# Patient Record
Sex: Male | Born: 1990 | Race: Black or African American | Hispanic: No | Marital: Single | Smoking: Never smoker
Health system: Southern US, Community
[De-identification: ages and names within clinical notes are randomized; demographics above are authoritative.]

## PROBLEM LIST (undated history)

## (undated) DIAGNOSIS — B20 Human immunodeficiency virus [HIV] disease: Secondary | ICD-10-CM

## (undated) DIAGNOSIS — Z789 Other specified health status: Secondary | ICD-10-CM

## (undated) DIAGNOSIS — Z21 Asymptomatic human immunodeficiency virus [HIV] infection status: Secondary | ICD-10-CM

## (undated) DIAGNOSIS — J45909 Unspecified asthma, uncomplicated: Secondary | ICD-10-CM

## (undated) HISTORY — DX: Asymptomatic human immunodeficiency virus (hiv) infection status: Z21

## (undated) HISTORY — DX: Unspecified asthma, uncomplicated: J45.909

## (undated) HISTORY — DX: Human immunodeficiency virus (HIV) disease: B20

---

## 2014-04-30 ENCOUNTER — Encounter (INDEPENDENT_AMBULATORY_CARE_PROVIDER_SITE_OTHER): Payer: Self-pay

## 2014-04-30 ENCOUNTER — Telehealth (INDEPENDENT_AMBULATORY_CARE_PROVIDER_SITE_OTHER): Payer: Self-pay

## 2014-04-30 NOTE — Telephone Encounter (Signed)
Contacted new patient before intake appointment to offer transportation assistance for the first appointment and to make sure he knows what to bring in and where to go but was unable to speak to patient and left a voicemail with name and number to call back.

## 2014-04-30 NOTE — Progress Notes (Signed)
*  04-30-14 Phone Intake Interview  New Intake  Client is a 23 year old [place] American male who currently resides in [place], [place] with his partner. Client states he was first diagnosed on September 11, 2013 and was referred from his previous medical care in [place]. Client states he is currently not employed and does not receive income. Client states he does have [employer] as his medical insurance. Client states he doesn't know his CD4 count, viral load or when he had his last TB test. Client states he was hospitalized a month ago for a bloody stool. Client states he also has asthma. Client states he has no history of mental illness or substance abuse and does not require special needs. Client was instructed to bring in his photo ID, verification of address, proof of income, medical insurance card and western blot. Client will receive his care at [facility].     In-Person Intake Appointment:  Eligibility Visit: May 01, 2014 at 2:00 PM with Cleatrice Burke   Nurse Visit: May 01, 2014 at 3:00 PM with Hazle Nordmann   Social Worker Visit: May 01, 2014 at 4:00 PM with Clinton Gallant

## 2014-05-01 ENCOUNTER — Ambulatory Visit (INDEPENDENT_AMBULATORY_CARE_PROVIDER_SITE_OTHER): Payer: Self-pay

## 2014-05-01 VITALS — BP 121/85 | HR 88 | Temp 98.5°F

## 2014-05-01 DIAGNOSIS — B20 Human immunodeficiency virus [HIV] disease: Secondary | ICD-10-CM

## 2014-05-01 DIAGNOSIS — K6289 Other specified diseases of anus and rectum: Secondary | ICD-10-CM

## 2014-05-01 NOTE — Progress Notes (Signed)
This Clinical research associate met with the Timothy Monroe for intake. Timothy Monroe is a 23 year old homosexual African American male who appeared his stated age. Timothy Monroe recently moved from [place] to [place] with his partner. Timothy Monroe was diagnosed in 2014 and has not started ART. Timothy Monroe has been in foster care since age 36 and will age out at 62. Timothy Monroe has [employer] in [place] and was advised to contact [facility] to see if Medicaid can be offered to him in [place]. Timothy Monroe was advised that during this process, he can be enrolled in [facility]. Timothy Monroe is scheduled to return next week for psychosocial and education and two weeks for H&P.

## 2014-05-02 DIAGNOSIS — J45909 Unspecified asthma, uncomplicated: Secondary | ICD-10-CM | POA: Insufficient documentation

## 2014-05-02 DIAGNOSIS — B20 Human immunodeficiency virus [HIV] disease: Secondary | ICD-10-CM | POA: Insufficient documentation

## 2014-05-02 DIAGNOSIS — K6289 Other specified diseases of anus and rectum: Secondary | ICD-10-CM | POA: Insufficient documentation

## 2014-05-02 LAB — T-HELPER CELLS (CD4) COUNT
% CD 4 Positive Lymphocytes: 21.2 % — ABNORMAL LOW (ref 30.8–58.5)
Absolute CD 4 Helper: 297 /uL — ABNORMAL LOW (ref 359–1519)
Baso(Absolute): 0.1 10*3/uL (ref 0.0–0.2)
Basos: 1 %
Eos: 0 %
Eosinophils Absolute: 0 10*3/uL (ref 0.0–0.4)
Hematocrit: 41.5 % (ref 37.5–51.0)
Hemoglobin: 14.6 g/dL (ref 12.6–17.7)
Immature Granulocytes Absolute: 0 10*3/uL (ref 0.0–0.1)
Immature Granulocytes: 0 %
Lymphocytes Absolute: 1.4 10*3/uL (ref 0.7–3.1)
Lymphocytes: 19 %
MCH: 29.7 pg (ref 26.6–33.0)
MCHC: 35.2 g/dL (ref 31.5–35.7)
MCV: 85 fL (ref 79–97)
Monocytes Absolute: 0.4 10*3/uL (ref 0.1–0.9)
Monocytes: 6 %
Neutrophils Absolute: 5.8 10*3/uL (ref 1.4–7.0)
Neutrophils: 74 %
Platelets: 407 10*3/uL — ABNORMAL HIGH (ref 150–379)
RBC: 4.91 x10E6/uL (ref 4.14–5.80)
RDW: 13.9 % (ref 12.3–15.4)
WBC: 7.8 10*3/uL (ref 3.4–10.8)

## 2014-05-02 NOTE — Progress Notes (Signed)
Met patient jointly with SW for intake    Intake    Patient is a 23 year old, friendly AA man who recently moved to [place] from [place] a couple of weeks ago. He is currently unemployed. He is still in forster care until he turns 23 in December, 2015. He has [employer] from [place] through forster care. He is living with a friend in [place]. He is currently working on changing his Medicaid from [place] to [place]. He got in contact with his SW in [place] and discussed how to change it.     Medical History    Patient diagnosed in 08/2013 at [facility] in [place]. His CD4 nadir was 381, there is no record of VL. Labs were drawn again and CD4 and VL were 391 and 64403 on 10/09/13.  He was infected via a sexual encounter with a male. He is allergic to Acetaminophen and water melons. He is naive to HAART. He has asthma, a history of GC and chlamydia proctitis, has current anal pain, history of positive stool guaiac. Patient had CT of abdomen and pelvis for anal pain and it was negative. Patient denies substance abuse, he says he has no mental health issues. He is a social drinker. He started smoking cigarettes when the anal pain started, he was not a smoker before. He says when he gets the anal pain, he has to smoke and it eases the pain.   Patient was in so much pain when he came in for intake. PA saw him and examined his anal area, patient was screaming in pain. He was advised to go to the ER. We offered to intake patient another day, he declined and asked to proceed with the intake. He promised to go to the ER after intake.      Family History    Patient is one for 14 siblings. Both his parents are alive and are in [place]. He was put into forster care when his father was in jail and his mother could look after all the children. He has shared his diagnosis with his father only. His family is aware of his sexuality.     Plan    As patient was in pain, I called [facility] and faxed a signed release of information from patient and the records were  faxed. Patient had intake labs drawn and he is scheduled for HIV education, PSA with me and SW next week and an H&P with PA is in two weeks. Patient advised to go to the ER and get medical attention for the anal pain. He verbalized understanding the instructions.

## 2014-05-03 ENCOUNTER — Telehealth (INDEPENDENT_AMBULATORY_CARE_PROVIDER_SITE_OTHER): Payer: Self-pay

## 2014-05-03 LAB — HIV-1/2 ANTIBODY, DIFFERENTIATION
HIV 1 Ab: REACTIVE — AB
HIV 2 Ab: NONREACTIVE

## 2014-05-03 LAB — REFLEX - QUANTIFERON IN TUBE
QFT TB Ag minus Nil Value: 0 IU/mL
QuantiFERON Mitogen Value: 9.79 IU/mL
QuantiFERON Nil Value: 0.04 IU/mL
QuantiFERON TB Ag Value: 0.04 IU/mL
QuantiFERON TB Gold: NEGATIVE

## 2014-05-03 LAB — QUANTIFERON(R)-TB GOLD ITM (HLAB)

## 2014-05-03 NOTE — Telephone Encounter (Signed)
Called patient and left a message on his voicemail. Patient was supposed to go to the ER soon after leaving here on Tuesday for medical attention to his anal pain. I checked patient's chart and he was not seen at any [facility] facilities. I also asked him what day would be convenient to be seen by a colon rectal surgeon. I asked him to call me when he gets a chance.

## 2014-05-04 LAB — HIV-1 RNA, QUANTITATIVE, PCR
HIV-1 RNA by PCR: 19780 copies/mL
LOG10 HIV-1 RNA: 4.296 log10copy/mL

## 2014-05-04 LAB — TOXOPLASMA GONDII ANTIBODY, IGG: Toxoplasma Gondii, IgG: 62.9 IU/mL — ABNORMAL HIGH (ref 0.0–7.1)

## 2014-05-04 LAB — RPR, RFX QN RPR/CONFIRM TP: RPR: NONREACTIVE

## 2014-05-04 LAB — HLA-B 5701 GENOTYPE, ABACAVIR: Result:: NEGATIVE

## 2014-05-05 LAB — COMPREHENSIVE METABOLIC PANEL
ALT: 31 IU/L (ref 0–44)
AST (SGOT): 43 IU/L — ABNORMAL HIGH (ref 0–40)
Albumin/Globulin Ratio: 1 — ABNORMAL LOW (ref 1.1–2.5)
Albumin: 4.3 g/dL (ref 3.5–5.5)
Alkaline Phosphatase: 52 IU/L (ref 39–117)
BUN / Creatinine Ratio: 12 (ref 8–19)
BUN: 10 mg/dL (ref 6–20)
Bilirubin, Total: 0.3 mg/dL (ref 0.0–1.2)
CO2: 18 mmol/L (ref 18–29)
Calcium: 9 mg/dL (ref 8.7–10.2)
Chloride: 99 mmol/L (ref 97–108)
Creatinine: 0.82 mg/dL (ref 0.76–1.27)
EGFR: 127 mL/min/{1.73_m2} (ref >59)
EGFR: 147 mL/min/{1.73_m2} (ref >59)
Globulin, Total: 4.4 g/dL (ref 1.5–4.5)
Glucose: 48 mg/dL — ABNORMAL LOW (ref 65–99)
Potassium: 6.7 mmol/L (ref 3.5–5.2)
Protein, Total: 8.7 g/dL — ABNORMAL HIGH (ref 6.0–8.5)
Sodium: 145 mmol/L — ABNORMAL HIGH (ref 134–144)

## 2014-05-05 LAB — G-6-PD, RBC: G-6-PD, Quant: 129 U/10E12 RBC — ABNORMAL LOW (ref 146–376)

## 2014-05-11 ENCOUNTER — Ambulatory Visit (INDEPENDENT_AMBULATORY_CARE_PROVIDER_SITE_OTHER): Payer: Self-pay

## 2014-05-11 DIAGNOSIS — Z719 Counseling, unspecified: Secondary | ICD-10-CM

## 2014-05-11 DIAGNOSIS — B20 Human immunodeficiency virus [HIV] disease: Secondary | ICD-10-CM

## 2014-05-11 NOTE — Progress Notes (Signed)
Met with patient for HIV basic education. Patient admitted he did not go to the ER for medical care of the anal pain when he was intaked last week. He told me the anal pain is better and he continues to take the antibiotics and take sitz baths before and after bowel movements. Patient expressed concern about his low CD4. He has an appt for an H&P with PA next week. He has no resolved the issue with his Medicaid. ADAP forms completed and faxed to [facility] so that when he sees PA, he is approved.   Covered the following during the education session:   How HIV is transmitted - sexual intercourse (anal or vaginal), IV drug use, pregnant mother who does not get HIV treatment during pregnancy and who breast feeds the newborn   Discussed that HIV is not transmitted through sweat, tears, sharing toilet seats, drinks, silverware, or eating from someone's plate, donating blood at a blood bank, being kissed/coughed or sneezed on   Identified high risk behavior and how to prevent transmission to toher   The meaning of CD4 count and HIV viral load in tracking disease progression and medication effectiveness   Discussed safer sex practices   The importance of food safety, good nutrition in promoting health   The need for exercise and rest in promoting health   How to care for pets while avoiding infections   Drinking water from a reliable water source such as water tap and not wells   The ill effects of alcohol, tobacco products and illicit drugs on the body and its ability to heal   How to prevent HIV transmission from accidents in the home   Benefits and difficulties of treatment   Benefits and difficulties of disclosure    We also discussed the importance of coming to all scheduled appts or calling to reschedule. Patient asked questions during the discussion. He had no concerns at end of visit.

## 2014-05-14 ENCOUNTER — Other Ambulatory Visit (INDEPENDENT_AMBULATORY_CARE_PROVIDER_SITE_OTHER): Payer: Self-pay

## 2014-05-14 ENCOUNTER — Encounter (INDEPENDENT_AMBULATORY_CARE_PROVIDER_SITE_OTHER): Payer: Self-pay

## 2014-05-14 NOTE — Telephone Encounter (Signed)
Error

## 2014-05-14 NOTE — Progress Notes (Signed)
Called [facility] regarding genotype results not in chart. Spoke with Pam and she said labs take 7 to 10 business day. Emailed [facility] Dana Pius she is out of office until Wednesday. Called PA-C to inform her that labs are not ready and pt has an appt for tomorrow for H&P.

## 2014-05-15 ENCOUNTER — Encounter (INDEPENDENT_AMBULATORY_CARE_PROVIDER_SITE_OTHER): Payer: Self-pay | Admitting: Medical

## 2014-05-15 ENCOUNTER — Ambulatory Visit (INDEPENDENT_AMBULATORY_CARE_PROVIDER_SITE_OTHER): Payer: Self-pay | Admitting: Medical

## 2014-05-15 ENCOUNTER — Ambulatory Visit (INDEPENDENT_AMBULATORY_CARE_PROVIDER_SITE_OTHER): Payer: Self-pay

## 2014-05-15 VITALS — BP 117/73 | HR 105 | Temp 99.5°F | Wt 158.6 lb

## 2014-05-15 DIAGNOSIS — K6289 Other specified diseases of anus and rectum: Secondary | ICD-10-CM

## 2014-05-15 DIAGNOSIS — Z23 Encounter for immunization: Secondary | ICD-10-CM

## 2014-05-15 DIAGNOSIS — B20 Human immunodeficiency virus [HIV] disease: Secondary | ICD-10-CM

## 2014-05-15 NOTE — Addendum Note (Signed)
Addended by: Joni Reining on: 05/15/2014 04:50 PM     Modules accepted: Orders

## 2014-05-15 NOTE — Progress Notes (Signed)
Subjective:       Patient ID: Timothy Monroe is a 23 y.o. male.    HPI  Timothy Monroe is 23 year old AA homosexual male, he is here today to follow up on his HIV disease.  He was diagnosed with HIV on 08/2013, while he was seen at ER for severe rectal pain, he was diagnosed with HIV and proctitis.  He believes he became infected via unprotected sex , he is naive to HAART.  Today he continues to complaint of rectal pain and brown d/c. He is on naproxen for pain, he was seen during his intake and was advised to go to the  ER due to severe pain, he didn't go.  He agrees today to have an anal pap/ gc/chlamydia testing. He will  Be refer to colorectal.  The following portions of the patient's history were reviewed and updated as appropriate: past family history and past social history.    Review of Systems   Constitutional: Negative.    HENT: Negative.    Eyes: Negative.    Respiratory: Negative.    Cardiovascular: Negative.    Gastrointestinal: Positive for rectal pain. Negative for nausea, vomiting, abdominal pain, diarrhea, constipation, blood in stool, abdominal distention and anal bleeding.   Endocrine: Negative.    Genitourinary: Negative.    Musculoskeletal: Negative.    Skin: Negative.    Allergic/Immunologic: Negative.    Neurological: Negative.    Hematological: Negative.    Psychiatric/Behavioral: Negative.            Objective:    Physical Exam   Constitutional: He is oriented to person, place, and time. He appears well-developed and well-nourished. No distress.   HENT:   Head: Normocephalic and atraumatic.   Mouth/Throat: Oropharynx is clear and moist. No oropharyngeal exudate.   Large tonsils. Gc/chlamydia done,   Eyes: Conjunctivae and EOM are normal. Pupils are equal, round, and reactive to light. No scleral icterus.   Neck: Normal range of motion. Neck supple. No tracheal deviation present. No thyromegaly present.   Cardiovascular: Normal rate, regular rhythm, normal heart sounds and intact distal pulses.     No murmur heard.  Pulmonary/Chest: Effort normal and breath sounds normal. No respiratory distress. He has no wheezes. He has no rales.   Abdominal: Soft. Bowel sounds are normal. He exhibits no distension and no mass. There is no tenderness. There is no rebound and no guarding.   Genitourinary: Penis normal. No penile tenderness.   Agree to anal pap/gc/chlamydia testing of rectum.  Tolerated procedure well, brown d/c noted.   Musculoskeletal: Normal range of motion. He exhibits no edema or tenderness.   Lymphadenopathy:     He has no cervical adenopathy.   Neurological: He is alert and oriented to person, place, and time. No cranial nerve deficit.   Skin: Skin is warm and dry. No rash noted. He is not diaphoretic. No erythema.   Psychiatric: He has a normal mood and affect. His behavior is normal. Judgment and thought content normal.   Vitals reviewed.          Assessment:     HIV   HM  Rectal pain      Plan:      Procedures  HIV: stable, cd4a t 297 and vl 19780, diagnosed with HIV on 08/2013.  Naive to HAART, Clinical research associate explained HIV disease course and prognosis, meaning of current cd4 and vl counts, risk and benefits of HAART, importance of adherence to treatment and follow ups.  Writer discussed several treatment options" stribild/triumeq/or PI base , he decided to start "Stribild" , genotype is pending, if pan-sensitive, he will start 'stribild.  Safe sex practices discussed, encouraged 100% condom use.  HM: pneumovax 13 today, states he is utd with dtap. Needs to do hepatitis panel. Will follow up result.

## 2014-05-15 NOTE — Progress Notes (Signed)
Timothy Monroe from [facility] called and explained that the during the lab first run the amplification failed and they had to reset it and it takes approximately 8-10 days to have a result. She said we will get the results by Friday or Saturday.

## 2014-05-16 ENCOUNTER — Encounter (INDEPENDENT_AMBULATORY_CARE_PROVIDER_SITE_OTHER): Payer: Self-pay

## 2014-05-16 LAB — COMPREHENSIVE METABOLIC PANEL
ALT: 33 IU/L (ref 0–44)
AST (SGOT): 29 IU/L (ref 0–40)
Albumin/Globulin Ratio: 1 — ABNORMAL LOW (ref 1.1–2.5)
Albumin: 4.1 g/dL (ref 3.5–5.5)
Alkaline Phosphatase: 51 IU/L (ref 39–117)
BUN / Creatinine Ratio: 19 (ref 8–19)
BUN: 14 mg/dL (ref 6–20)
Bilirubin, Total: 0.3 mg/dL (ref 0.0–1.2)
CO2: 23 mmol/L (ref 18–29)
Calcium: 9.3 mg/dL (ref 8.7–10.2)
Chloride: 100 mmol/L (ref 97–108)
Creatinine: 0.75 mg/dL — ABNORMAL LOW (ref 0.76–1.27)
EGFR: 132 mL/min/{1.73_m2} (ref >59)
EGFR: 153 mL/min/{1.73_m2} (ref >59)
Globulin, Total: 4.2 g/dL (ref 1.5–4.5)
Glucose: 85 mg/dL (ref 65–99)
Potassium: 4.6 mmol/L (ref 3.5–5.2)
Protein, Total: 8.3 g/dL (ref 6.0–8.5)
Sodium: 141 mmol/L (ref 134–144)

## 2014-05-16 LAB — HP5+HAVIGM PANEL 201359
HCV AB: <0.1 s/co ratio (ref 0.0–0.9)
Hep A IgM: NEGATIVE
Hepatitis  B Surface Ab, Qualitative: REACTIVE
Hepatitis A Total Antibody: POSITIVE — AB
Hepatitis B Core Total AB: NEGATIVE
Hepatitis B Surface Antigen: NEGATIVE

## 2014-05-16 LAB — ANAL (RECTAL) CYTOLOGY, LIQUID BASED PAP

## 2014-05-16 NOTE — Progress Notes (Signed)
Called [facility] [phone] ) and scheduled an appt for patient. Patient will see Dr Rema Jasmine on 05/23/14 at 1pm. Provider note and specialty referral faxed to the office [phone]). Patient given the address for his appt: [address], [address], [place], [place] [address], tel: [phone]. Patient verbalized understanding instructions.
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
## 2014-05-17 LAB — CT/GC NAA, PHARYNGEAL
C. trachomatis, NAA, Pharyn: NEGATIVE
N. gonorrhoeae, NAA, Pharyn: NEGATIVE

## 2014-05-17 LAB — CT/GC NAA, RECTAL
CHLAMYDIA TRACHOMATIS, NAA: NEGATIVE
Neisseria gonorrhoeae, NAA: NEGATIVE
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
## 2014-05-21 ENCOUNTER — Other Ambulatory Visit (INDEPENDENT_AMBULATORY_CARE_PROVIDER_SITE_OTHER): Payer: Self-pay

## 2014-05-21 LAB — HIV-1 GENOTYPIC DRUG RESISTANCE, P

## 2014-05-21 NOTE — Progress Notes (Signed)
Called [facility] the results for genosure labs are not ready as we were told. I spoke with Iman [facility] representative and she said it could take up to 21 days for results to be in. She asked for our office phone number so she could call back with more information about the status of the lab. Emailed Timothy Monroe regarding this matter and asked her to please find more information.

## 2014-05-22 MED ORDER — ELVITEG-COBIC-EMTRICIT-TENOFOVIR-DISOPROXIL-FUMARATE 150-150-200-300 MG PO TABS
1.0000 | ORAL_TABLET | Freq: Once | ORAL | Status: AC
Start: 2014-05-22 — End: 2014-05-22

## 2014-05-23 ENCOUNTER — Telehealth (INDEPENDENT_AMBULATORY_CARE_PROVIDER_SITE_OTHER): Payer: Self-pay

## 2014-05-23 NOTE — Telephone Encounter (Signed)
NOD received call from Dr. Rema Jasmine.  Rectal swab for G/C/ negative on 05/15/14. Dr. Rema Jasmine evaluated and scoped patient and found typical g/c. Pt was prescribed doxycycline by Dr. Rema Jasmine.  Pt has not received ceftriaxone.  Pt has Nurse Visit appointment on 05/23/14.  Writer called and informed  Risk manager.  Visit notes and records to be faxed to [facility] clinic.

## 2014-05-24 ENCOUNTER — Ambulatory Visit (INDEPENDENT_AMBULATORY_CARE_PROVIDER_SITE_OTHER): Payer: Self-pay

## 2014-05-24 DIAGNOSIS — Z719 Counseling, unspecified: Secondary | ICD-10-CM

## 2014-05-24 NOTE — Progress Notes (Signed)
[email or web address]

## 2014-05-25 ENCOUNTER — Other Ambulatory Visit (INDEPENDENT_AMBULATORY_CARE_PROVIDER_SITE_OTHER): Payer: Self-pay

## 2014-05-25 MED ORDER — CEFTRIAXONE SODIUM 250 MG IJ SOLR
250.0000 mg | Freq: Once | INTRAMUSCULAR | Status: AC
Start: 2014-05-25 — End: 2014-05-25

## 2014-05-25 NOTE — Progress Notes (Signed)
Nurse Case Management Note      1. Referrals/Appointments:       _X__ Stable       ___ Need    2. Mental Health Assessment:       _X__ Stable       ___ Need    3. Substance Abuse:       _X__ Stable       ___ Need    4. Social:       _X__ Stable       ___ Need    5. Practical Assistance:       _X__ Stable       ___ Need    6. Medical Education:       ___ Stable              _X__ Need       ___ Patient reports 100% adherence       ___ Patient denies medication side effects       ___ Nurse reinforces medication adherence       ___ Nurse filled patient's pill boxes    7. Other:       ___ Need    8. CM Checklist:       __X_ Teaching done       _X__ Care coordination       ___ Service plan updated       _X__ Next CM appointment       ___ ISP update due    Patient verbalized readiness to start Stribild today. Went over again the temporary and common side effects and how to manage them. Also discussed signs and symptoms of severe liver problems: yellowing of whites of eyes, "tea colored" urine and light colored bowel movements. If patient gets the following symptoms which may indicate acid build up (unusual muscle pain, feeling very tired, trouble breathing, stomach pain with nausea or vomiting, feeling cold and especially in arms and legs, dizziness or lightheaded, he has to go to the ER. Patient verbalized understanding the instructions. Patient given a pill box and he filled a week of meds. He had no questions end of visit. He is scheduled to meet with me next week.   Patient shared with me that he may move back to [place], we looked up a [facility] program close to where he will be living. We called and left a message for the office to call him back. Patient will update me if he gets a call back from the office in [place].

## 2014-05-28 ENCOUNTER — Ambulatory Visit (INDEPENDENT_AMBULATORY_CARE_PROVIDER_SITE_OTHER): Payer: Self-pay

## 2014-05-28 DIAGNOSIS — Z719 Counseling, unspecified: Secondary | ICD-10-CM

## 2014-05-28 NOTE — Progress Notes (Signed)
Nurse Case Management Note      1. Referrals/Appointments:       _X__ Stable       ___ Need    2. Mental Health Assessment:       __X_ Stable       ___ Need    3. Substance Abuse:       _X__ Stable       ___ Need    4. Social:       __X_ Stable       ___ Need    5. Practical Assistance:       __X_ Stable       ___ Need    6. Medical Education:       ___ Stable              _X__ Need       __X_ Patient reports 100% adherence       _X__ Patient denies medication side effects       _X__ Nurse reinforces medication adherence       ___ Nurse filled patient's pill boxes    7. Other:       _X__ Need: will get a shot of Rocephin    8. CM Checklist:       _X__ Teaching done       __X_ Care coordination       ___ Service plan updated       _X__ Next CM appointment       ___ ISP update due    Patient has been on Stibild for six days. After two days of taking Stribild, patient called to let me know he was having nausea. Patient also taking doxycycline. Called PA and she advised patient takes doxycycline with food. Patient started taking both doxycycline and Stribild with food and the nausea stopped. Patient denies any pain. Praised for not missing any Stribild. Patient also here to get a shot of Rocephin 250 mg IM as recommended by Colorectal Surgeon Dr Rema Jasmine. Patient given the shot on his left upper left gluteal. Patient was asked to lie down for 15 minutes after which he denied feeling faint, weakness or nausea. He left without any concerns/complaints.  Patient advised to call if he gets any side effects. Patient filled a week of meds in his pill box. He had no concerns or questions at end of visit.

## 2014-05-31 ENCOUNTER — Ambulatory Visit (INDEPENDENT_AMBULATORY_CARE_PROVIDER_SITE_OTHER): Payer: Self-pay

## 2014-06-04 ENCOUNTER — Ambulatory Visit (INDEPENDENT_AMBULATORY_CARE_PROVIDER_SITE_OTHER): Payer: Self-pay

## 2014-06-04 DIAGNOSIS — B20 Human immunodeficiency virus [HIV] disease: Secondary | ICD-10-CM

## 2014-06-04 NOTE — Progress Notes (Signed)
Nurse Case Management Note      1. Referrals/Appointments:       __X_ Stable       ___ Need    2. Mental Health Assessment:       _X__ Stable       ___ Need    3. Substance Abuse:       _X__ Stable       ___ Need    4. Social:       _X__ Stable       ___ Need    5. Practical Assistance:       _X__ Stable       ___ Need    6. Medical Education:       ___ Stable              _X__ Need       _X__ Patient reports 100% adherence       _X__ Patient denies medication side effects       ___ Nurse reinforces medication adherence       ___ Nurse filled patient's pill boxes    7. Other:       __X_ Need: Look for an HIV program in [place]    8. CM Checklist:       _X__ Teaching done       _X__ Care coordination       ___ Service plan updated       _X__ Next CM appointment       ___ ISP update due    Patient praised for taking meds as prescribed. He denies missing any meds in the past seven days. He is starting week three of [employer]: filled another week of taking Stribild. Safety labs drawn and patient scheduled for f/u with PA. I contacted because patient has anal pain when is standing up. PA advised patient takes Motrin 600 mg po every 6-8 hours with food. Patient verbalized understanding the instructions. Patient may be moving back to [place]. Called a [employer] Program [phone]) and was given two clinics which patient can continue his care when he moves back. Patient will call the [facility] on [phone] and/or [facility] ([phone]) and schedule an appt. Patient will let me know when he gets a call from any of the two potential clinics. He had no concerns at end of visit.

## 2014-06-05 LAB — COMPREHENSIVE METABOLIC PANEL
ALT: 11 IU/L (ref 0–44)
AST (SGOT): 21 IU/L (ref 0–40)
Albumin/Globulin Ratio: 1.2 (ref 1.1–2.5)
Albumin: 4.2 g/dL (ref 3.5–5.5)
Alkaline Phosphatase: 51 IU/L (ref 39–117)
BUN / Creatinine Ratio: 9 (ref 8–19)
BUN: 8 mg/dL (ref 6–20)
Bilirubin, Total: 0.4 mg/dL (ref 0.0–1.2)
CO2: 25 mmol/L (ref 18–29)
Calcium: 9.7 mg/dL (ref 8.7–10.2)
Chloride: 99 mmol/L (ref 97–108)
Creatinine: 0.85 mg/dL (ref 0.76–1.27)
EGFR: 125 mL/min/{1.73_m2} (ref >59)
EGFR: 145 mL/min/{1.73_m2} (ref >59)
Globulin, Total: 3.6 g/dL (ref 1.5–4.5)
Glucose: 82 mg/dL (ref 65–99)
Potassium: 4.5 mmol/L (ref 3.5–5.2)
Protein, Total: 7.8 g/dL (ref 6.0–8.5)
Sodium: 140 mmol/L (ref 134–144)

## 2014-06-05 LAB — CBC AND DIFFERENTIAL
Baso(Absolute): 0 10*3/uL (ref 0.0–0.2)
Basos: 0 %
Eos: 1 %
Eosinophils Absolute: 0 10*3/uL (ref 0.0–0.4)
Hematocrit: 41.3 % (ref 37.5–51.0)
Hemoglobin: 13.8 g/dL (ref 12.6–17.7)
Immature Granulocytes Absolute: 0 10*3/uL (ref 0.0–0.1)
Immature Granulocytes: 0 %
Lymphocytes Absolute: 1.6 10*3/uL (ref 0.7–3.1)
Lymphocytes: 32 %
MCH: 28.5 pg (ref 26.6–33.0)
MCHC: 33.4 g/dL (ref 31.5–35.7)
MCV: 85 fL (ref 79–97)
Monocytes Absolute: 0.3 10*3/uL (ref 0.1–0.9)
Monocytes: 6 %
Neutrophils Absolute: 3 10*3/uL (ref 1.4–7.0)
Neutrophils: 61 %
Platelets: 249 10*3/uL (ref 150–379)
RBC: 4.84 x10E6/uL (ref 4.14–5.80)
RDW: 15.1 % (ref 12.3–15.4)
WBC: 4.9 10*3/uL (ref 3.4–10.8)

## 2014-06-05 LAB — LIPID PANEL
Cholesterol / HDL Ratio: 3.3 ratio units (ref 0.0–5.0)
Cholesterol: 158 mg/dL (ref 100–189)
HDL: 48 mg/dL (ref >39)
LDL Calculated: 91 mg/dL (ref 0–119)
Triglycerides: 94 mg/dL (ref 0–114)
VLDL Calculated: 19 mg/dL (ref 5–40)

## 2014-06-06 ENCOUNTER — Other Ambulatory Visit (INDEPENDENT_AMBULATORY_CARE_PROVIDER_SITE_OTHER): Payer: Self-pay

## 2014-06-06 DIAGNOSIS — Z0000 Encounter for general adult medical examination without abnormal findings: Secondary | ICD-10-CM

## 2014-06-08 ENCOUNTER — Encounter (INDEPENDENT_AMBULATORY_CARE_PROVIDER_SITE_OTHER): Payer: Self-pay

## 2014-06-11 ENCOUNTER — Ambulatory Visit (INDEPENDENT_AMBULATORY_CARE_PROVIDER_SITE_OTHER): Payer: Self-pay

## 2014-06-11 ENCOUNTER — Encounter (INDEPENDENT_AMBULATORY_CARE_PROVIDER_SITE_OTHER): Payer: Self-pay

## 2014-06-11 VITALS — BP 118/71 | HR 86 | Temp 98.5°F

## 2014-06-11 DIAGNOSIS — Z719 Counseling, unspecified: Secondary | ICD-10-CM

## 2014-06-11 NOTE — Progress Notes (Signed)
Nurse Case Management Note      1. Referrals/Appointments:       __X_ Stable       ___ Need    2. Mental Health Assessment:       _X__ Stable       ___ Need    3. Substance Abuse:       __X_ Stable       ___ Need    4. Social:       __X_ Stable       ___ Need    5. Practical Assistance:       _X__ Stable       ___ Need    6. Medical Education:       _X__ Stable              ___ Need       _X__ Patient reports 100% adherence       _X__ Patient denies medication side effects       ___ Nurse reinforces medication adherence       ___ Nurse filled patient's pill boxes    7. Other:       _X__ Need: requested extra 30 day supply of Stribild    8. CM Checklist:       _X__ Teaching done       _X__ Care coordination       ___ Service plan updated       _X__ Next CM appointment       ___ ISP update due    Patient's vs are within normal range. He denies pain. Patient praised for taking meds as prescribed. Patient has not missed any Stribild since he started the meds. He denies any side effects. Patient discussed with me that he is going to move back to [place]. He requested to an extra 30 dayas of meds so that he has time to call and schedule an intake appt. Form completed and faxed to [facility]. Patient filled a week of meds and will come in on Thursday to meet with me. He had no complaints/concerns.

## 2014-06-13 ENCOUNTER — Ambulatory Visit (INDEPENDENT_AMBULATORY_CARE_PROVIDER_SITE_OTHER): Payer: Self-pay

## 2014-06-13 ENCOUNTER — Encounter (INDEPENDENT_AMBULATORY_CARE_PROVIDER_SITE_OTHER): Payer: Self-pay

## 2014-06-13 DIAGNOSIS — Z719 Counseling, unspecified: Secondary | ICD-10-CM

## 2014-06-13 NOTE — Progress Notes (Signed)
Nurse Case Management Note      1. Referrals/Appointments:       _X__ Stable       ___ Need    2. Mental Health Assessment:       _X__ Stable       ___ Need    3. Substance Abuse:       _X__ Stable       ___ Need    4. Social:       _X__ Stable       ___ Need    5. Practical Assistance:       _X__ Stable       ___ Need    6. Medical Education:       ___ Stable              _X__ Need       _X__ Patient reports 100% adherence       _X__ Patient denies medication side effects       __X_ Nurse reinforces medication adherence       ___ Nurse filled patient's pill boxes    7. Other:       ___ Need    8. CM Checklist:       __X_ Teaching done       _X__ Care coordination       ___ Service plan updated       ___ Next CM appointment       ___ ISP update due    Patient denies missing any meds in the last three days. He denies side effects. Patient picked his meds. He had an extra 30 days of Stribild. He asked to have his labs, provider note, meds list and immunization records printed. He wants to take them with him to [place] where he is now moving to. We identified a clinic he can get HIV care. We discussed the importance of taking care of the medical records. He promised to keep them with him. Patient will be discharged effective today.

## 2014-06-14 ENCOUNTER — Ambulatory Visit (INDEPENDENT_AMBULATORY_CARE_PROVIDER_SITE_OTHER): Payer: Self-pay

## 2014-07-09 ENCOUNTER — Encounter (INDEPENDENT_AMBULATORY_CARE_PROVIDER_SITE_OTHER): Payer: Self-pay

## 2016-02-22 ENCOUNTER — Emergency Department (HOSPITAL_COMMUNITY)
Admission: EM | Admit: 2016-02-22 | Discharge: 2016-02-23 | Disposition: A | Discharge status: Home / Self Care | Payer: Self-pay | Attending: Emergency Medicine | Admitting: Emergency Medicine

## 2016-02-22 ENCOUNTER — Encounter (HOSPITAL_COMMUNITY): Payer: Self-pay

## 2016-02-22 DIAGNOSIS — Z789 Other specified health status: Secondary | ICD-10-CM | POA: Insufficient documentation

## 2016-02-22 DIAGNOSIS — F430 Acute stress reaction: Secondary | ICD-10-CM

## 2016-02-22 DIAGNOSIS — F4325 Adjustment disorder with mixed disturbance of emotions and conduct: Secondary | ICD-10-CM | POA: Insufficient documentation

## 2016-02-22 DIAGNOSIS — F439 Reaction to severe stress, unspecified: Secondary | ICD-10-CM | POA: Insufficient documentation

## 2016-02-22 DIAGNOSIS — R451 Restlessness and agitation: Secondary | ICD-10-CM | POA: Insufficient documentation

## 2016-02-22 DIAGNOSIS — F1210 Cannabis abuse, uncomplicated: Secondary | ICD-10-CM | POA: Insufficient documentation

## 2016-02-22 DIAGNOSIS — F1410 Cocaine abuse, uncomplicated: Secondary | ICD-10-CM | POA: Insufficient documentation

## 2016-02-22 DIAGNOSIS — F1310 Sedative, hypnotic or anxiolytic abuse, uncomplicated: Secondary | ICD-10-CM | POA: Insufficient documentation

## 2016-02-22 HISTORY — DX: Other specified health status: Z78.9

## 2016-02-22 LAB — CBC WITH DIFFERENTIAL/PLATELET
BASOS ABS: 0 10*3/uL (ref 0.0–0.1)
Basophils Relative: 0 %
Eosinophils Absolute: 0 10*3/uL (ref 0.0–0.7)
Eosinophils Relative: 0 %
HCT: 46.2 % (ref 39.0–52.0)
Hemoglobin: 16.1 g/dL (ref 13.0–17.0)
LYMPHS ABS: 2.1 10*3/uL (ref 0.7–4.0)
LYMPHS PCT: 27 %
MCH: 29.8 pg (ref 26.0–34.0)
MCHC: 34.8 g/dL (ref 30.0–36.0)
MCV: 85.4 fL (ref 78.0–100.0)
Monocytes Absolute: 0.4 10*3/uL (ref 0.1–1.0)
Monocytes Relative: 5 %
Neutro Abs: 5.2 10*3/uL (ref 1.7–7.7)
Neutrophils Relative %: 68 %
PLATELETS: 305 10*3/uL (ref 150–400)
RBC: 5.41 MIL/uL (ref 4.22–5.81)
RDW: 12.8 % (ref 11.5–15.5)
WBC: 7.7 10*3/uL (ref 4.0–10.5)

## 2016-02-22 LAB — ACETAMINOPHEN LEVEL

## 2016-02-22 LAB — ETHANOL: Alcohol, Ethyl (B): <5 mg/dL (ref <5)

## 2016-02-22 LAB — COMPREHENSIVE METABOLIC PANEL
ALK PHOS: 62 U/L (ref 38–126)
ALT: 14 U/L — AB (ref 17–63)
AST: 26 U/L (ref 15–41)
Albumin: 4.5 g/dL (ref 3.5–5.0)
Anion gap: 8 (ref 5–15)
BILIRUBIN TOTAL: 0.8 mg/dL (ref 0.3–1.2)
BUN: 14 mg/dL (ref 6–20)
CALCIUM: 9.9 mg/dL (ref 8.9–10.3)
CO2: 26 mmol/L (ref 22–32)
CREATININE: 0.94 mg/dL (ref 0.61–1.24)
Chloride: 102 mmol/L (ref 101–111)
GFR calc non Af Amer: >60 mL/min (ref >60)
Glucose, Bld: 94 mg/dL (ref 65–99)
Potassium: 4 mmol/L (ref 3.5–5.1)
SODIUM: 136 mmol/L (ref 135–145)
TOTAL PROTEIN: 10.7 g/dL — AB (ref 6.5–8.1)

## 2016-02-22 LAB — RAPID URINE DRUG SCREEN, HOSP PERFORMED
Amphetamines: NOT DETECTED
BENZODIAZEPINES: POSITIVE — AB
Barbiturates: NOT DETECTED
Cocaine: POSITIVE — AB
OPIATES: NOT DETECTED
Tetrahydrocannabinol: POSITIVE — AB

## 2016-02-22 LAB — SALICYLATE LEVEL

## 2016-02-22 MED ORDER — LORAZEPAM 2 MG/ML IJ SOLN
2.0000 mg | Freq: Once | INTRAMUSCULAR | Status: AC
  Administered 2016-02-22: 2 mg via INTRAMUSCULAR
  Filled 2016-02-22: qty 1

## 2016-02-22 MED ORDER — IBUPROFEN 200 MG PO TABS: 400.0000 mg | ORAL_TABLET | Freq: Four times a day (QID) | ORAL | Status: DC | PRN

## 2016-02-22 MED ORDER — NICOTINE 21 MG/24HR TD PT24
21.0000 mg | MEDICATED_PATCH | Freq: Once | TRANSDERMAL | Status: AC
  Administered 2016-02-22 – 2016-02-23 (×2): 21 mg via TRANSDERMAL
  Filled 2016-02-22 (×2): qty 1

## 2016-02-22 MED ORDER — DIPHENHYDRAMINE HCL 50 MG/ML IJ SOLN
25.0000 mg | Freq: Once | INTRAMUSCULAR | Status: AC
  Administered 2016-02-22: 25 mg via INTRAMUSCULAR
  Filled 2016-02-22: qty 1

## 2016-02-22 MED ORDER — ZIPRASIDONE MESYLATE 20 MG IM SOLR
20.0000 mg | Freq: Once | INTRAMUSCULAR | Status: AC
Start: 2016-02-22 — End: 2016-02-22
  Administered 2016-02-22: 20 mg via INTRAMUSCULAR
  Filled 2016-02-22: qty 20

## 2016-02-22 MED ORDER — STERILE WATER FOR INJECTION IJ SOLN
INTRAMUSCULAR | Status: AC
  Administered 2016-02-22: 10 mL
  Filled 2016-02-22: qty 10

## 2016-02-22 MED ORDER — LORAZEPAM 1 MG PO TABS
1.0000 mg | ORAL_TABLET | ORAL | Status: DC | PRN
Start: 2016-02-22 — End: 2016-02-23
  Filled 2016-02-22: qty 1

## 2016-02-22 NOTE — ED Notes (Signed)
He has eaten lunch and remains calm and cooperative.  Nicotine patch given per pt. Request.

## 2016-02-22 NOTE — ED Notes (Signed)
Called Poison control and reported ingestion. Per Macario Goldsoshanna at poison control, just monitor labs and they will call back to obtain results. Pt is beligerant and refuses to allow labs, vital signs or other care.

## 2016-02-22 NOTE — ED Notes (Signed)
He has become aggressively agitated, stating he is "gonna leave right now".  Mr. Ricky Barker, our [employer] Officer is speaking with pt. As I give his ordered meds., which pt. Tolerates well.  He also is changed into scrubs at this time and is wanded by security.

## 2016-02-22 NOTE — ED Notes (Signed)
[employer] Officers bring him with [employer] Papers indicating pt. Had used s.i. Language and admitted to taking ten or 11 Valium tablets and 4-5 beers today.  He is alert, oriented and paranoid and quite agitated in his demeanor.  The officers are able to speak with him and redirect him. Dr. Tyrone Nine met him upon arrival; and pt. Tells him that he does not want any medications.  Dr. Tyrone Nine instructs him that we will attempt to honor this, however, if pt. Becomes belligerant or a danger to himself or others, it will me necessary to medicate him.  He is loud and profane at times.

## 2016-02-22 NOTE — ED Notes (Signed)
Pt. Friend gives his number as [phone].

## 2016-02-22 NOTE — ED Provider Notes (Signed)
CSN: [id number]     Arrival date & time 02/22/16  1131 History   First MD Initiated Contact with Patient 02/22/16 1137     Chief Complaint  Patient presents with  . Suicidal  . Ingestion     (Consider location/radiation/quality/duration/timing/severity/associated sxs/prior Treatment) Patient is a 25 y.o. male presenting with Ingested Medication and mental health disorder. The history is provided by the patient.  Ingestion This is a new problem. The current episode started 3 to 5 hours ago. The problem occurs constantly. The problem has not changed since onset.Pertinent negatives include no chest pain, no abdominal pain, no headaches and no shortness of breath.  Mental Health Problem Presenting symptoms: aggressive behavior, suicidal thoughts and suicidal threats   Patient accompanied by:  Law enforcement Degree of incapacity (severity):  Severe Onset quality:  Sudden Treatment compliance:  Untreated Relieved by:  Nothing Worsened by:  Nothing tried Ineffective treatments:  None tried Associated symptoms: no abdominal pain, no chest pain and no headaches    25 yo M With a chief complaint of suicidal ideation. Patient denies all such complaint. Patient was completed by 6 police officers. He has been agitated and threatening violence with them. Patient denies any such complaints. Feel that he's been completely compliant with the police officers. He is demanding the IVC paperwork because it was initially filled out without the correct name. He went this documented so that his lawyer can see it. Patient denies suicidal or homicidal ideation. Patient denies drug ingestion.  Per IVC paperwork patient had been threatening suicide. I see paperwork was filled out by his friends do not know his legal name. He also told his friend that he taken about 10 Ativan tablets.  Past Medical History  Diagnosis Date  . Patient refuses to disclose advance directive information    No past surgical history  on file. History reviewed. No pertinent family history. Social History  Substance Use Topics  . Smoking status: Smoker, Current Status Unknown  . Smokeless tobacco: None  . Alcohol Use: None     Comment: He refuses to answer any questions.    Review of Systems  Constitutional: Negative for fever and chills.  HENT: Negative for congestion and facial swelling.   Eyes: Negative for discharge and visual disturbance.  Respiratory: Negative for shortness of breath.   Cardiovascular: Negative for chest pain and palpitations.  Gastrointestinal: Negative for vomiting, abdominal pain and diarrhea.  Musculoskeletal: Negative for myalgias and arthralgias.  Skin: Negative for color change and rash.  Neurological: Negative for tremors, syncope and headaches.  Psychiatric/Behavioral: Positive for suicidal ideas. Negative for confusion and dysphoric mood.      Allergies  Review of patient's allergies indicates not on file.  Home Medications   Prior to Admission medications   Not on File   There were no vitals taken for this visit. Physical Exam  Constitutional: He is oriented to person, place, and time. He appears well-developed and well-nourished.  HENT:  Head: Normocephalic and atraumatic.  Eyes: EOM are normal. Pupils are equal, round, and reactive to light.  Neck: Normal range of motion. Neck supple. No JVD present.  Cardiovascular: Normal rate and regular rhythm.  Exam reveals no gallop and no friction rub.   No murmur heard. Pulmonary/Chest: No respiratory distress. He has no wheezes.  Abdominal: He exhibits no distension. There is no rebound and no guarding.  Musculoskeletal: Normal range of motion.  Neurological: He is alert and oriented to person, place, and time.  Skin: No  rash noted. No pallor.  Psychiatric: He has a normal mood and affect. His behavior is normal.  Nursing note and vitals reviewed.   ED Course  Procedures (including critical care time) Labs  Review Labs Reviewed  COMPREHENSIVE METABOLIC PANEL  ETHANOL  CBC WITH DIFFERENTIAL/PLATELET  URINE RAPID DRUG SCREEN, HOSP PERFORMED    Imaging Review No results found. I have personally reviewed and evaluated these images and lab results as part of my medical decision-making.   EKG Interpretation None      MDM   Final diagnoses:  Suicidal ideation    25 yo M with a chief complaint of suicidal ideation. Patient is agitated upon arrival is threatening violence with each person that he talks to. Patient becoming increasingly uncooperative threatening to urinate on the floor and "tear this hospital apart" we will chemically sedate. TTS eval when able.   Patient is positive for multiple different illegal drugs after stating that he had not taken any illegal drugs. There is some concern that his story is not consistent. He will be observed overnight and then reassess in the morning.  Upon receiving this information patient became irate and tried to elope from the ED. He was given Geodon and Ativan and Benadryl.  The patients results and plan were reviewed and discussed.   Any x-rays performed were independently reviewed by myself.   Differential diagnosis were considered with the presenting HPI.  Medications  nicotine (NICODERM CQ - dosed in mg/24 hours) patch 21 mg (21 mg Transdermal Patch Applied 02/22/16 1321)  ziprasidone (GEODON) injection 20 mg (20 mg Intramuscular Given 02/22/16 1419)  LORazepam (ATIVAN) injection 2 mg (2 mg Intramuscular Given 02/22/16 1420)  diphenhydrAMINE (BENADRYL) injection 25 mg (25 mg Intramuscular Given 02/22/16 1419)  sterile water (preservative free) injection (10 mLs  Given 02/22/16 1419)    Filed Vitals:   02/22/16 1435  BP: 117/79  Pulse: 74  Resp: 20  SpO2: 94%    Final diagnoses:  Suicidal ideation       Melene Planan Vaibhav Fogleman, DO 02/22/16 1539

## 2016-02-22 NOTE — ED Notes (Signed)
Pt has three white stone earrings

## 2016-02-22 NOTE — ED Notes (Signed)
He has markedly improved and is more calm and cooperative after having had a lengthy therapeutic discussion with Jovea from [employer]  He is to see psychiatry shortly also.  Meds held and Dr. Adela LankFloyd is aware.

## 2016-02-22 NOTE — BH Assessment (Signed)
Kipp BroodDana Elliott petitioner's contact information [phone] reports that he can be reached to provide additional information or if the patient would like to call him.   Davina PokeJoVea Jamayia Croker, LCSW Therapeutic Triage Specialist [facility] 02/22/2016 2:13 PM'
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
## 2016-02-22 NOTE — BH Assessment (Signed)
Consulted with Ricky MeansJamison Lord, DNP who recommends patient be observed overnight and evaluated in the morning by psychiatry to uphold or rescind IVC. Informed EDP of recommendation.   Davina PokeJoVea Cheynne Virden, LCSW Therapeutic Triage Specialist [facility] 02/22/2016 3:03 PM

## 2016-02-22 NOTE — BH Assessment (Signed)
Spoke with Annabelle HarmanDana who initiated IVC paperwork. Annabelle HarmanDana states that he is concerned about the patient due to patient sending texts saying "goodbye" and "I;m done." Annabelle HarmanDana states that after the text messages he went to see the patient at his apartment and the patient refused to answer the door. Annabelle HarmanDana states that through the door the patient stated that he took valium. Annabelle HarmanDana states that he became more concerned and did not want the patient to harm himself and contacted the police who went to the apartment. The patient did not answer the door and Annabelle HarmanDana states that [employer] informed him of the IVC process and he decided to complete an IVC due to his concern for the patient overdosing on valium.  Annabelle HarmanDana states that he has had four friends who committed suicide and he felt like he needed to reach out to professionals "because i can't go through that again." Annabelle HarmanDana requested to see patient. Annabelle HarmanDana was wanded and escorted back into the patients room.   Davina PokeJoVea Analisa Sledd, LCSW Therapeutic Triage Specialist [facility] 02/22/2016 1:25 PM

## 2016-02-22 NOTE — ED Notes (Signed)
Transferred from Res B on stretcher.  Resp even and unlabored.  Call to staffing office to request sitter due to SI and elopement risk.

## 2016-02-22 NOTE — ED Notes (Signed)
We have just cleansed a light brown very soft stool from his bottom, with his assistance.  He is drowsy and cooperative.

## 2016-02-22 NOTE — ED Notes (Signed)
Unable to take vitals at this time. PT upset talking to MD

## 2016-02-22 NOTE — ED Notes (Signed)
I have just given his lab results/condition report to Rosiland Ozoshonna, Charity fundraiserN at [facility].  She states they will close out his case; and to call them if we ever need their assistance.

## 2016-02-23 MED ORDER — DIPHENHYDRAMINE HCL 50 MG/ML IJ SOLN
50.0000 mg | Freq: Once | INTRAMUSCULAR | Status: DC
  Filled 2016-02-23: qty 1

## 2016-02-23 MED ORDER — NICOTINE POLACRILEX 2 MG MT GUM
4.0000 mg | CHEWING_GUM | OROMUCOSAL | Status: DC | PRN
  Administered 2016-02-23: 4 mg via ORAL
  Filled 2016-02-23: qty 2

## 2016-02-23 MED ORDER — LORAZEPAM 2 MG/ML IJ SOLN
2.0000 mg | Freq: Once | INTRAMUSCULAR | Status: DC
  Filled 2016-02-23: qty 1

## 2016-02-23 MED ORDER — ZIPRASIDONE MESYLATE 20 MG IM SOLR
20.0000 mg | Freq: Once | INTRAMUSCULAR | Status: DC
  Filled 2016-02-23: qty 20

## 2016-02-23 NOTE — Discharge Instructions (Signed)
It was our pleasure to provide your ER care today - we hope that you feel better.  Follow up with primary care doctor in the coming week.  Also see list of community resources.  For mental health issues and/or crisis, you may go directly to [facility].  Return to ER if worse, new symptoms, medical emergency, other concern.        Stress and Stress Management Stress is a normal reaction to life events. It is what you feel when life demands more than you are used to or more than you can handle. Some stress can be useful. For example, the stress reaction can help you catch the last bus of the day, study for a test, or meet a deadline at work. But stress that occurs too often or for too long can cause problems. It can affect your emotional health and interfere with relationships and normal daily activities. Too much stress can weaken your immune system and increase your risk for physical illness. If you already have a medical problem, stress can make it worse. CAUSES  All sorts of life events may cause stress. An event that causes stress for one person may not be stressful for another person. Major life events commonly cause stress. These may be positive or negative. Examples include losing your job, moving into a new home, getting married, having a baby, or losing a loved one. Less obvious life events may also cause stress, especially if they occur day after day or in combination. Examples include working long hours, driving in traffic, caring for children, being in debt, or being in a difficult relationship. SIGNS AND SYMPTOMS Stress may cause emotional symptoms including, the following:  Anxiety. This is feeling worried, afraid, on edge, overwhelmed, or out of control.  Anger. This is feeling irritated or impatient.  Depression. This is feeling sad, down, helpless, or guilty.  Difficulty focusing, remembering, or making decisions. Stress may cause physical symptoms, including the  following:   Aches and pains. These may affect your head, neck, back, stomach, or other areas of your body.  Tight muscles or clenched jaw.  Low energy or trouble sleeping. Stress may cause unhealthy behaviors, including the following:   Eating to feel better (overeating) or skipping meals.  Sleeping too little, too much, or both.  Working too much or putting off tasks (procrastination).  Smoking, drinking alcohol, or using drugs to feel better. DIAGNOSIS  Stress is diagnosed through an assessment by your health care provider. Your health care provider will ask questions about your symptoms and any stressful life events.Your health care provider will also ask about your medical history and may order blood tests or other tests. Certain medical conditions and medicine can cause physical symptoms similar to stress. Mental illness can cause emotional symptoms and unhealthy behaviors similar to stress. Your health care provider may refer you to a mental health professional for further evaluation.  TREATMENT  Stress management is the recommended treatment for stress.The goals of stress management are reducing stressful life events and coping with stress in healthy ways.  Techniques for reducing stressful life events include the following:  Stress identification. Self-monitor for stress and identify what causes stress for you. These skills may help you to avoid some stressful events.  Time management. Set your priorities, keep a calendar of events, and learn to say "no." These tools can help you avoid making too many commitments. Techniques for coping with stress include the following:  Rethinking the problem. Try to think  realistically about stressful events rather than ignoring them or overreacting. Try to find the positives in a stressful situation rather than focusing on the negatives.  Exercise. Physical exercise can release both physical and emotional tension. The key is to find a  form of exercise you enjoy and do it regularly.  Relaxation techniques. These relax the body and mind. Examples include yoga, meditation, tai chi, biofeedback, deep breathing, progressive muscle relaxation, listening to music, being out in nature, journaling, and other hobbies. Again, the key is to find one or more that you enjoy and can do regularly.  Healthy lifestyle. Eat a balanced diet, get plenty of sleep, and do not smoke. Avoid using alcohol or drugs to relax.  Strong support network. Spend time with family, friends, or other people you enjoy being around.Express your feelings and talk things over with someone you trust. Counseling or talktherapy with a mental health professional may be helpful if you are having difficulty managing stress on your own. Medicine is typically not recommended for the treatment of stress.Talk to your health care provider if you think you need medicine for symptoms of stress. HOME CARE INSTRUCTIONS  Keep all follow-up visits as directed by your health care provider.  Take all medicines as directed by your health care provider. SEEK MEDICAL CARE IF:  Your symptoms get worse or you start having new symptoms.  You feel overwhelmed by your problems and can no longer manage them on your own. SEEK IMMEDIATE MEDICAL CARE IF:  You feel like hurting yourself or someone else.   This information is not intended to replace advice given to you by your health care provider. Make sure you discuss any questions you have with your health care provider.   Document Released: 05/12/2001 Document Revised: 12/07/2014 Document Reviewed: 07/11/2013 Elsevier Interactive Patient Education 2016 [place] [facility] The United Ways 211 is a great source of information about community services available.  Access by dialing 2-1-1 from anywhere in [place], or by website -  CustodianSupply.fi.    Other Local Resources (Updated 12/2015)  [employer]  Crisis Hotline, available 24 hours a day, 7 days a week: [place], [place]   [facility]  Crisis Hotline, available 24 hours a day, 7 days a week: [place], [place]  [facility]  Suicide Prevention Hotline, available 24 hours a day, 7 days a week: [place], [place], available 24 hours a day, 7 days a week: [place], [place] Access to [employer], available 24 hours a day, 7 days a week: 785-118-0723 All   [facility]  Crisis Hotline, available 24 hours a day, 7 days a week: 920-455-3087 All   Other Local Resources (Updated 12/2015)  Outpatient Counseling/ Substance Abuse Programs  Services     Address and Phone Number  ADS (Alcohol and Drug Services)   Options include Individual counseling, group counseling, intensive outpatient program (several hours a day, several days a week)  Offers depression assessments  Provides methadone maintenance program [phone] [address], [place], [place] partial hospitalization/day treatment and DUI/DWI programs  [employer], private insurance [phone] [address] [place], [place] [address]  [place]  include intensive outpatient program (several hours a day, several days a week), outpatient treatment, DUI/DWI services, family education  Also has some services specifically for [employer] transitional housing  [phone] [address] [place], [place] [address]     [place] Medicare, private pay, and private insurance [phone] [address] [place], [place] [address]  [facility]   Services include individual counseling, substance abuse intensive outpatient program (several hours a day, several days a week), day treatment  Blinda [place]atherwood, Medicaid, private insurance [phone] [address], [place], [place] [address]  [facility]   Offers substance abuse intensive outpatient program (several hours a day, several days a week), partial hospitalization program [phone] [address] [place], [place] [address]  [phone] [address] [place], [place] [address]  [phone] [place], [place] [address]  [phone], [place], [place] [address]  [facility]  Individual counseling only  Accepts private insurance only [phone] [address], [place], [place] [address]  [facility]  Methadone maintenance program [phone] [address], [place] [address]  [facility] providing substance abuse and mental health counseling  Accepts Medicaid, Medicare, private insurance  Offers sliding scale for uninsured [phone] [place], [place] in [place] individual counseling, and intensive in-home services [phone] [address], [place], [place] [address]  [facility] individual counseling, family counseling, group therapy, domestic violence counseling, consumer credit counseling  Accepts Medicare, Medicaid, private insurance  Offers sliding scale for uninsured [phone] [address] [place], [place] [address]  [phone] [facility], [address] [place], [place]  [facility]  Offers individual, family and group counseling  3 locations - [place], [place], and [place]  [address] [place], [place] [address]  [address] [place], [place]  [address]  [place], [place] [address]  [facility]    Offers psychiatric assessment, 8-week Intensive Outpatient Program (several hours a day, several times a week, daytime or evenings), early recovery group, family Program, medication management  Private pay or private insurance only [phone], or  [phone] [address] [place], [place] [address]  [facility]  Offers individual, couples and family counseling  Accepts Medicaid, private insurance, and sliding scale for uninsured [phone] [address] [place], [place] [address]  Launa Flight, MD  Individual counseling  Private insurance [phone] [place], [place] [address]  [facility]   Offers assessment, substance abuse treatment, and behavioral health treatment [phone] [address] [place], [place] [address]  [facility]  Individual counseling  Accepts private insurance [phone] [place], [place] [address]  [facility]  Individual counseling  Blinda Leatherwood, private insurance [phone] [place], [place] [address]  [facility]    Offers intensive outpatient program (several hours a day, several times a week)  Private pay, private insurance [place], [place]  Individual counseling  Medicare, private insurance [phone] [address] [place], [place] [address]  [facility]    Offers intensive outpatient program (several hours a day, several times a week) and partial hospitalization program [phone] [place], [place] [address]  Letta Moynahan, MD  Individual counseling [phone] [address], [place], [place] [address]  [facility] to  individuals, couples, and families  Accepts Medicare and private insurance; offers sliding scale for uninsured [phone] [place], [place] [address]  [facility]  Christian counseling [phone] [address] [place], [place] [address]  [facility] [employer] crisis counseling, individual counseling, group therapy, in-home therapy, domestic violence services, day treatment, DWI services, Conservation officer, nature (CST), Actuary (ACTT), substance abuse Intensive Outpatient Program (several hours a day, several times a week)  2 locations - [place] and [place], [place] [address]  [phone] [address] [place], [place] [address]  [place] counseling and group therapy  [employer], [place], [place] [phone] [address] [place], [place]  [facility]  Offers individual and family counseling  Offers LGBTQ services  Accepts private insurance and private pay [phone] [place], [place] [address]  [facility]    Offers individual counseling, group therapy, and outpatient detox  Accepts private insurance [phone] [place], [place] Medicare, private insurance [phone] [address] [place], [place] [address]  [employer]  Individual counseling  Accepts Medicare, private insurance [phone] [address] [place], [place] [address]  [facility] [place] substance abuse Intensive Outpatient Program (several hours a day, several times a week) [phone], or [phone] [place], [place]

## 2016-02-23 NOTE — Clinical Social Work Note (Signed)
CSW provided MD with rescind IVC paperwork.  Client's RN provided with taxi voucher. Client discharging home  .Ricky Bubaegina Josph [place]et, LCSW [facility] Clinical Social Worker - Weekend Coverage cell #: [phone]

## 2016-02-23 NOTE — Progress Notes (Addendum)
Pt stated, "This is all a misunderstanding. I broke up with my BF,  Valentino, of three years and texted my friend Annabelle HarmanDana. He took my text the wrong way and now I am here. 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 He stated he has never tried to hurt himself and has no reason to want to die. Pt has a job, a house and a 10634 year old daughter that he cares for with his  He admits he smokes pot everyday. Pt has very good eye contact. He would like to show the MD the text messages so he can hopefully leave today. Pt denies SI and HI and contracts for safety. Charge spoke with EDP to get pt nicotene gum. Pt stated, "I just want to go to work and sleep in my kingsize bed."8:10am Pt slammed his door and barrcaded his self in the room. Pt was mad that the MD wiill  not be here for at least one hour. [employer] was called along with security. EDP was phoned well. 8:20pm- Dr. Leo RodSteinel spoke with the pt and now the pt is calm. Per EDP hold the meds for now. 8;25am -TTS in with the pt. 8:50am -Per Dr. Leo RodSteinel pt is for discharge. Pt denies SI and HI and contracts for safety.

## 2016-02-23 NOTE — BHH Counselor (Signed)
Patient was upset and this Clinical research associatewriter went into his room. Patient had calmed down and states that he is being told various things about when he will speak with a physician. Patient is alert and oriented and calm and cooperative with this Clinical research associatewriter. Patient denies SI/HI and AVH. Patient does not appear to be responding to internal stimuli. EDP spoke with patient. EDP states that he will rescind IVC. Informed EDP that patient has not seen psychiatry but denies SI/HI AVH to this writer and that patient was informed that he would wait until later to see the psychiatrist. EDP states that he will speak with patient again.   Davina PokeJoVea Skyley Grandmaison, LCSW Therapeutic Triage Specialist [facility] 02/23/2016 9:01 AM

## 2016-02-23 NOTE — ED Notes (Signed)
Patient continues to rest quietly with eyes closed and resp even and unlabored.  Psych assessment deferred.

## 2016-02-23 NOTE — BH Assessment (Signed)
Contacted Anne Hahnndera Kimbley - sister - [phone] who patient states is his guardian to make her aware of him being seen in the ED. There was no message and no identifying information on voicemail. Requested patient contact guardian and inform his nurse so we could speak with her.  No message was left.   Davina PokeJoVea Darnisha Vernet, LCSW Therapeutic Triage Specialist [facility] 02/23/2016 8:59 AM

## 2016-02-23 NOTE — ED Provider Notes (Addendum)
Patient became agitated/upset this morning due to being told another 3 hr delay.   I entered room and spoke with patient. Patient calm, conversant.   States yesterday he was very upset re breakup of relationship, but that he never had thoughts of killing himself, or harming others.  He denies hx psychiatric illness or prior tx for same. Indicates he does have hx anxiety/stress, and that he self medicates thc for that.  Patient indicates he is student, and working, and that we plans to continue with both.   Patient exhibits no acute psychosis.  His thoughts processes are clear, normal mood, and continues to deny any thoughts of self harm, or harm to others.  [facility] now in room to reassess.     Cathren LaineKevin Diamone Whistler, MD 02/23/16 [phone]  Discussed w BH team - assessment indicates feels patient stable for d/c.  Recheck pt, remains calm, cooperative. Is calling for ride.  Pt currently appears stable for d/c.     Cathren LaineKevin Breasia Karges, MD 02/23/16 [phone]

## 2016-03-13 ENCOUNTER — Encounter (HOSPITAL_COMMUNITY): Payer: Self-pay | Admitting: Emergency Medicine

## 2016-03-13 ENCOUNTER — Emergency Department (HOSPITAL_COMMUNITY)
Admission: EM | Admit: 2016-03-13 | Discharge: 2016-03-13 | Disposition: A | Discharge status: Home / Self Care | Payer: Self-pay | Attending: Emergency Medicine | Admitting: Emergency Medicine

## 2016-03-13 DIAGNOSIS — R300 Dysuria: Secondary | ICD-10-CM | POA: Insufficient documentation

## 2016-03-13 DIAGNOSIS — N4889 Other specified disorders of penis: Secondary | ICD-10-CM | POA: Insufficient documentation

## 2016-03-13 DIAGNOSIS — R369 Urethral discharge, unspecified: Secondary | ICD-10-CM | POA: Insufficient documentation

## 2016-03-13 LAB — URINE MICROSCOPIC-ADD ON

## 2016-03-13 LAB — URINALYSIS, ROUTINE W REFLEX MICROSCOPIC
Glucose, UA: NEGATIVE mg/dL
Ketones, ur: 15 mg/dL — AB
NITRITE: NEGATIVE
PH: 6 (ref 5.0–8.0)
Protein, ur: 30 mg/dL — AB
SPECIFIC GRAVITY, URINE: 1.031 — AB (ref 1.005–1.030)

## 2016-03-13 MED ORDER — LIDOCAINE HCL (PF) 1 % IJ SOLN
INTRAMUSCULAR | Status: AC
  Administered 2016-03-13: 1.5 mL
  Filled 2016-03-13: qty 30

## 2016-03-13 MED ORDER — AZITHROMYCIN 250 MG PO TABS
1000.0000 mg | ORAL_TABLET | Freq: Once | ORAL | Status: AC
  Administered 2016-03-13: 1000 mg via ORAL
  Filled 2016-03-13: qty 4

## 2016-03-13 MED ORDER — CEFTRIAXONE SODIUM 250 MG IJ SOLR
250.0000 mg | Freq: Once | INTRAMUSCULAR | Status: AC
  Administered 2016-03-13: 250 mg via INTRAMUSCULAR
  Filled 2016-03-13: qty 250

## 2016-03-13 NOTE — ED Notes (Signed)
Small amout of bright bloody discharge noted post void

## 2016-03-13 NOTE — ED Notes (Signed)
Patient here with complaints of penis pain. Reports that he is bleeding from urethra. Denies injury or new sexual partners.

## 2016-03-13 NOTE — ED Notes (Signed)
UNABLE TO COLLECT LABS AT THIS TIME NURSE AND PA  WORKING WITH PATIENT

## 2016-03-13 NOTE — ED Provider Notes (Signed)
CSN: [id number]     Arrival date & time 03/13/16  1149 History  By signing my name below, I, Tanda Rockers, attest that this documentation has been prepared under the direction and in the presence of Melburn Hake, PA-C. Electronically Signed: Tanda Rockers, ED Scribe. 03/13/2016. 2:00 PM.   Chief Complaint  Patient presents with  . Penis Pain  . Penile Discharge   The history is provided by the patient. No language interpreter was used.     HPI Comments: Terion Hedman is a 25 y.o. male who presents to the Emergency Department complaining of blood from penis and white penile discharge x 3 days. Pt also complains of dysuria and intermittent penile swelling. Pt has never had these symptoms in the past. Pt is sexually actve with 1 male partner. His partner denies having any symptoms at this time. His partner was recently tested for STDs but has not heard back about the results yet. Pt is concerned for STD exposure but is not concerned for HIV exposure. Denies fever, abdominal pain, nausea, vomiting, diarrhea, testicular swelling, or any other associated symptoms.   Past Medical History  Diagnosis Date  . Patient refuses to disclose advance directive information    History reviewed. No pertinent past surgical history. History reviewed. No pertinent family history. Social History  Substance Use Topics  . Smoking status: Smoker, Current Status Unknown  . Smokeless tobacco: None  . Alcohol Use: None     Comment: He refuses to answer any questions.    Review of Systems  Constitutional: Negative for fever.  Gastrointestinal: Negative for nausea, vomiting, abdominal pain and diarrhea.  Genitourinary: Positive for dysuria, discharge and penile swelling. Negative for scrotal swelling and testicular pain.   Allergies  Acetaminophen and Cantaloupe (diagnostic)  Home Medications   Prior to Admission medications   Not on File   BP 115/81 mmHg  Pulse 97  Temp(Src) 98.3 F (36.8 C)  (Oral)  Resp 16  SpO2 100%   Physical Exam  Constitutional: He is oriented to person, place, and time. He appears well-developed and well-nourished.  HENT:  Head: Normocephalic and atraumatic.  Mouth/Throat: Oropharynx is clear and moist. No oropharyngeal exudate.  Eyes: Conjunctivae and EOM are normal. Right eye exhibits no discharge. Left eye exhibits no discharge. No scleral icterus.  Neck: Normal range of motion. Neck supple.  Cardiovascular: Normal rate.   Pulmonary/Chest: Effort normal.  Abdominal: Soft. He exhibits no distension. There is no tenderness. Hernia confirmed negative in the right inguinal area and confirmed negative in the left inguinal area.  Genitourinary: Testes normal. Right testis shows no mass, no swelling and no tenderness. Left testis shows no mass, no swelling and no tenderness. Circumcised. No hypospadias, penile erythema or penile tenderness. Discharge found.  Small amount of brown purulent drainage present.   Lymphadenopathy:       Right: No inguinal adenopathy present.       Left: No inguinal adenopathy present.  Neurological: He is alert and oriented to person, place, and time.  Skin: Skin is warm and dry.  Nursing note and vitals reviewed.   ED Course  Procedures (including critical care time)  DIAGNOSTIC STUDIES: Oxygen Saturation is 97% on RA, normal by my interpretation.    COORDINATION OF CARE: 1:38 PM-Discussed treatment plan which includes UA, RPR, and HIV antibody with pt at bedside and pt agreed to plan.   Labs Review Labs Reviewed  URINALYSIS, ROUTINE W REFLEX MICROSCOPIC (NOT AT [facility]) - Abnormal; Notable for the following:  Color, Urine AMBER (*)    APPearance TURBID (*)    Specific Gravity, Urine 1.031 (*)    Hgb urine dipstick LARGE (*)    Bilirubin Urine SMALL (*)    Ketones, ur 15 (*)    Protein, ur 30 (*)    Leukocytes, UA LARGE (*)    All other components within normal limits  URINE MICROSCOPIC-ADD ON - Abnormal; Notable  for the following:    Squamous Epithelial / LPF 0-5 (*)    Bacteria, UA FEW (*)    All other components within normal limits  URINE CULTURE  RPR  HIV ANTIBODY (ROUTINE TESTING)  GC/CHLAMYDIA PROBE AMP ([place]) NOT AT [facility]    Imaging Review No results found. I have personally reviewed and evaluated these lab results as part of my medical decision-making.   EKG Interpretation None      MDM   Final diagnoses:  Penile discharge   Patient treated in the ED for STI with Rocephin injection and Azithromycin. Patient advised to inform and treat all sexual partners.  Pt advised on safe sex practices and understands that they have GC/Chlamydia cultures pending and will result in 2-3 days. HIV and RPR sent. Pt encouraged to follow up at local health department for future STI checks. No concern for prostatitis or epididymitis. Discussed return precautions. Pt appears safe for discharge.   I personally performed the services described in this documentation, which was scribed in my presence. The recorded information has been reviewed and is accurate.      Satira Sarkicole Elizabeth HartfordNadeau, [place] JerseyPA-C 03/13/16 1545  Samuel JesterKathleen McManus, DO 03/15/16 1427

## 2016-03-13 NOTE — Discharge Instructions (Signed)
1. Medications: usual home medications 2. Treatment: rest, drink plenty of fluids, use a condom with every sexual encounter 3. Follow Up: Please followup with your primary doctor in 3 days for discussion of your diagnoses and further evaluation after today's visit; if you do not have a primary care doctor use the resource guide provided to find one; Please return to the ER for worsening symptoms, high fevers or persistent vomiting. 4. Follow-up with a primary care provider in one week to have your urine rechecked to reevaluate for blood in your urine.  Return to the emergency department if symptoms worsen or new onset of fever, chills, body aches, abdominal pain, nausea, vomiting, worsening now discharge/unresolved discharge, penile/testicular pain or swelling, difficulty urinating, pain with urination, unable to urinate.  You have been tested for HIV, syphilis, chlamydia and gonorrhea.  These results will be available in approximately 3 days.  Please inform all sexual partners if you test positive for any of these diseases.

## 2016-03-14 ENCOUNTER — Telehealth: Payer: Self-pay | Admitting: Internal Medicine

## 2016-03-14 LAB — HIV ANTIBODY (ROUTINE TESTING W REFLEX)

## 2016-03-14 LAB — HIV 1/2 AB DIFFERENTIATION
HIV 1 AB: POSITIVE — AB
HIV 2 AB: NEGATIVE

## 2016-03-14 NOTE — Telephone Encounter (Signed)
Ricky Barker is a 25 year old was seen in the emergency department yesterday complaining of dysuria and penile discharge (white and bloody). He has been sexually active with one male partner. His HIV antibody test is positive. RPR, GC and Chlamydia results are pending. I called the phone number listed for him [phone] but there was no answer. I will try reaching him again tomorrow. If I'm unable to reach him we will have the health department DIS officers try to track him down.

## 2016-03-15 ENCOUNTER — Telehealth: Payer: Self-pay

## 2016-03-15 LAB — URINE CULTURE
Culture: 30000 — AB
Special Requests: NORMAL

## 2016-03-16 ENCOUNTER — Telehealth (HOSPITAL_BASED_OUTPATIENT_CLINIC_OR_DEPARTMENT_OTHER): Payer: Self-pay | Admitting: Emergency Medicine

## 2016-03-16 LAB — GC/CHLAMYDIA PROBE AMP (~~LOC~~) NOT AT ARMC
CHLAMYDIA, DNA PROBE: NEGATIVE
NEISSERIA GONORRHEA: POSITIVE — AB

## 2016-03-16 LAB — RPR, QUANT+TP ABS (REFLEX): T Pallidum Abs: POSITIVE — AB

## 2016-03-16 LAB — RPR: RPR: REACTIVE — AB

## 2016-03-16 NOTE — Progress Notes (Signed)
ED Antimicrobial Stewardship Positive Culture Follow Up   Ricky Barker is an 25 y.o. male who [facility] with a chief complaint of  Chief Complaint  Patient presents with  . Penis Pain  . Penile Discharge    Recent Results (from the past 720 hour(s))  Urine culture     Status: Abnormal   Collection Time: 03/13/16  3:10 PM  Result Value Ref Range Status   Specimen Description URINE, RANDOM  Final   Special Requests Normal  Final   Culture (A)  Final    30,000 COLONIES/mL DIPHTHEROIDS(CORYNEBACTERIUM SPECIES) Standardized susceptibility testing for this organism is not available. Performed at [facility]    Report Status 03/15/2016 FINAL  Final    Treated in ED with IM Rocephin and azithromycin. UA clear. No treatment indicated.  ED Provider: Noelle PennerSerena Sam, PA-C   Renata CapriceMeredith Kherington Meraz 03/16/2016, 8:37 AM PharmD Candidate

## 2016-03-16 NOTE — Telephone Encounter (Signed)
Post ED Visit - Positive Culture Follow-up  Culture report reviewed by antimicrobial stewardship pharmacist:  []  Enzo BiNathan Batchelder, Pharm.D. []  Celedonio MiyamotoJeremy Frens, Pharm.D., BCPS []  Garvin FilaMike Maccia, Pharm.D. []  Georgina PillionElizabeth Martin, Pharm.D., BCPS []  Ocklaw[place]Minh Pham, [place]arm.D., BCPS, AAHIVP []  Estella HuskMichelle Turner, Pharm.D., BCPS, AAHIVP []  Tennis Mustassie Stewart, Pharm.D. []  Rob Oswaldo DoneVincent, [address] BoulevardPharm[address]Becky Sax. Meagan Tilley PharmD   Positive urine culture  Treated with none, given Rocephin and azithromycin,no further patient follow-up is required at this time.  Berle MullMiller, Tannya Gonet 03/16/2016, 10:18 AM

## 2016-03-18 NOTE — Telephone Encounter (Signed)
Chart handoff to EDP for treatment plan for + RPR titer of 1:8

## 2016-03-19 NOTE — ED Provider Notes (Signed)
Reviewed culture reports.  Positive GC.   Treated in the ED.  + RPR.  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   Recommend PCN G benzathine 2.4 units IM q week for 3 weeks.  + HIV.  Infectious disease clinic attempting to notify patient.  Linwood DibblesJon Braelin Costlow, MD 03/19/16 [phone]

## 2016-03-23 NOTE — Telephone Encounter (Signed)
Letter returned, no forwarding address, lost to followup 

## 2016-03-24 ENCOUNTER — Ambulatory Visit: Payer: Self-pay

## 2016-04-07 ENCOUNTER — Telehealth: Payer: Self-pay

## 2016-04-07 NOTE — Telephone Encounter (Signed)
Patient was contacted regarding new HIV referral.  He refused appointment and stated [place] will be [place] to [place] to [place] care.   Tammy K King,RN

## 2016-06-13 ENCOUNTER — Telehealth: Payer: Self-pay | Admitting: *Deleted

## 2016-06-13 NOTE — Telephone Encounter (Signed)
No response to phone or letter regarding (+)GC notification.  Treated at ER visit

## 2016-06-13 NOTE — Telephone Encounter (Signed)
No response to phone or letter regarding notification of (+)GC, treated at ER visit

## 2020-11-12 ENCOUNTER — Emergency Department (HOSPITAL_COMMUNITY)

## 2020-11-12 ENCOUNTER — Emergency Department (HOSPITAL_COMMUNITY)
Admission: EM | Admit: 2020-11-12 | Discharge: 2020-11-12 | Disposition: A | Discharge status: Home / Self Care | Attending: Emergency Medicine | Admitting: Emergency Medicine

## 2020-11-12 ENCOUNTER — Encounter (HOSPITAL_COMMUNITY): Payer: Self-pay

## 2020-11-12 ENCOUNTER — Other Ambulatory Visit: Payer: Self-pay

## 2020-11-12 DIAGNOSIS — S0093XA Contusion of unspecified part of head, initial encounter: Secondary | ICD-10-CM | POA: Insufficient documentation

## 2020-11-12 DIAGNOSIS — W228XXA Striking against or struck by other objects, initial encounter: Secondary | ICD-10-CM | POA: Insufficient documentation

## 2020-11-12 DIAGNOSIS — F17210 Nicotine dependence, cigarettes, uncomplicated: Secondary | ICD-10-CM | POA: Diagnosis not present

## 2020-11-12 DIAGNOSIS — S0990XA Unspecified injury of head, initial encounter: Secondary | ICD-10-CM

## 2020-11-12 DIAGNOSIS — H209 Unspecified iridocyclitis: Secondary | ICD-10-CM

## 2020-11-12 DIAGNOSIS — H20011 Primary iridocyclitis, right eye: Secondary | ICD-10-CM | POA: Diagnosis not present

## 2020-11-12 DIAGNOSIS — S060X0A Concussion without loss of consciousness, initial encounter: Secondary | ICD-10-CM | POA: Diagnosis not present

## 2020-11-12 HISTORY — DX: Human immunodeficiency virus (HIV) disease: B20

## 2020-11-12 HISTORY — DX: Asymptomatic human immunodeficiency virus (hiv) infection status: Z21

## 2020-11-12 MED ORDER — PREDNISOLONE ACETATE 1 % OP SUSP
2.0000 [drp] | Freq: Four times a day (QID) | OPHTHALMIC | Status: DC
  Administered 2020-11-12: 2 [drp] via OPHTHALMIC
  Filled 2020-11-12: qty 5

## 2020-11-12 MED ORDER — PREDNISOLONE ACETATE 1 % OP SUSP: 1.0000 [drp] | Freq: Four times a day (QID) | OPHTHALMIC | 0 refills | Status: AC

## 2020-11-12 MED ORDER — FLUORESCEIN SODIUM 1 MG OP STRP
1.0000 | ORAL_STRIP | Freq: Once | OPHTHALMIC | Status: AC
  Administered 2020-11-12: 1 via OPHTHALMIC
  Filled 2020-11-12: qty 1

## 2020-11-12 MED ORDER — TETRACAINE HCL 0.5 % OP SOLN
2.0000 [drp] | Freq: Once | OPHTHALMIC | Status: AC
  Administered 2020-11-12: 2 [drp] via OPHTHALMIC
  Filled 2020-11-12: qty 4

## 2020-11-12 MED ORDER — ERYTHROMYCIN 5 MG/GM OP OINT: TOPICAL_OINTMENT | OPHTHALMIC | 0 refills | Status: AC

## 2020-11-12 MED ORDER — ERYTHROMYCIN 5 MG/GM OP OINT
1.0000 "application " | TOPICAL_OINTMENT | Freq: Three times a day (TID) | OPHTHALMIC | Status: DC
  Administered 2020-11-12: 1 via OPHTHALMIC
  Filled 2020-11-12: qty 3.5

## 2020-11-12 NOTE — ED Provider Notes (Signed)
6:50 PM I assumed care of the patient at signout.  I discussed CT results with the patient and correctional facility officers.  Discussed importance of using medication as directed as well.   Gerhard Munch, MD 11/12/20 1850

## 2020-11-12 NOTE — ED Triage Notes (Signed)
Pt reports he was banging his head against the wall and now has a hematoma to the front of his head. Bleeding controlled at this time. Pt now reports headache and blurred vision.

## 2020-11-12 NOTE — Discharge Instructions (Signed)
As discussed, today's evaluation has been generally reassuring.  However, there is evidence of both concussion and uveitis.  For the next 3 days, please use the provided erythromycin cream 4 times daily, and the eyedrops 4 times daily, 2 drops into the left eye.  Follow-up with our primary care clinic and with our ophthalmologist in 1 week, and 3 days respectively.  Return here for concerning changes in your condition.

## 2020-11-22 NOTE — ED Provider Notes (Signed)
[facility]-EMERGENCY DEPT Provider Note   CSN: [id number] Arrival date & time: 11/12/20  1556     History Chief Complaint  Patient presents with  . Head Injury    Ricky Barker is a 29 y.o. male.  HPI    29 y/o M with hx of HIV comes in with cc of blurry vision. Pt id currently incarcerated. He got upset about something and struck his head onto the wall several times. No LOC.. Pt does have blurry vision though and headaches. Not on blood thinners.  Past Medical History:  Diagnosis Date  . HIV (human immunodeficiency virus infection) (HCC)   . Patient refuses to disclose advance directive information     Patient Active Problem List   Diagnosis Date Noted  . Patient refuses to disclose advance directive information     History reviewed. No pertinent surgical history.     History reviewed. No pertinent family history.  Social History   Tobacco Use  . Smoking status: Smoker, Current Status Unknown    Home Medications Prior to Admission medications   Medication Sig Start Date End Date Taking? Authorizing Provider  erythromycin ophthalmic ointment Place a 1/2 inch ribbon of ointment into the lower eyelid four times daily. 11/12/20   Gerhard Munch, MD  prednisoLONE acetate (PRED FORTE) 1 % ophthalmic suspension Place 1 drop into the left eye 4 (four) times daily. 11/12/20   Gerhard Munch, MD    Allergies    Acetaminophen and Cantaloupe (diagnostic)  Review of Systems   Review of Systems  Constitutional: Positive for activity change.  Eyes: Positive for visual disturbance.  Neurological: Positive for headaches. Negative for dizziness.  Hematological: Does not bruise/bleed easily.  All other systems reviewed and are negative.   Physical Exam Updated Vital Signs BP (!) 158/114 (BP Location: Left Arm)   Pulse 77   Temp 98.6 F (37 C) (Oral)   Resp 18   SpO2 99%   Physical Exam Vitals and nursing note reviewed.   Constitutional:      Appearance: He is well-developed.  HENT:     Head: Atraumatic.  Eyes:     Extraocular Movements: Extraocular movements intact.     Pupils: Pupils are equal, round, and reactive to light.  Cardiovascular:     Rate and Rhythm: Normal rate.  Pulmonary:     Effort: Pulmonary effort is normal.  Musculoskeletal:     Cervical back: Neck supple.  Skin:    General: Skin is warm.     Findings: Bruising present.  Neurological:     Mental Status: He is alert and oriented to person, place, and time.     Cranial Nerves: No cranial nerve deficit.     Sensory: No sensory deficit.     ED Results / Procedures / Treatments   Labs (all labs ordered are listed, but only abnormal results are displayed) Labs Reviewed - No data to display  EKG None  Radiology No results found.  Procedures Ultrasound ED Ocular  Date/Time: 11/12/2020 4:30 PM Performed by: Derwood Kaplan, MD Authorized by: Derwood Kaplan, MD   PROCEDURE DETAILS:    Indications: evaluation for increased intracranial pressure, eye pain and visual change     Assessed:  Right eye   Images: archived   RIGHT EYE FINDINGS:     right eye lens not dislodged    no right eye increased optic nerve sheath diameter    no retrobulbar hematoma in right eye    no evidence of  retinal detachment of the right eye    no vitreous hemorrhage in right eye   (including critical care time)  Medications Ordered in ED Medications  fluorescein ophthalmic strip 1 strip (1 strip Left Eye Given 11/12/20 1834)  tetracaine (PONTOCAINE) 0.5 % ophthalmic solution 2 drop (2 drops Left Eye Given 11/12/20 1835)    ED Course  I have reviewed the triage vital signs and the nursing notes.  Pertinent labs & imaging results that were available during my care of the patient were reviewed by me and considered in my medical decision making (see chart for details).    MDM Rules/Calculators/A&P                          Head trauma  with large hematoma and visual disturbance. [place] is neg. CT orbits and head ordered.  IF CT neg, pt has concussion. Dr. Jeraldine Loots will follow.  Final Clinical Impression(s) / ED Diagnoses Final diagnoses:  Injury of head, initial encounter  Concussion without loss of consciousness, initial encounter  Traumatic iritis    Rx / DC Orders ED Discharge Orders         Ordered    erythromycin ophthalmic ointment        11/12/20 1850    prednisoLONE acetate (PRED FORTE) 1 % ophthalmic suspension  4 times daily        11/12/20 1850           Derwood Kaplan, MD 11/22/20 2310

## 2020-12-11 ENCOUNTER — Emergency Department (HOSPITAL_COMMUNITY)

## 2020-12-11 ENCOUNTER — Encounter (HOSPITAL_COMMUNITY): Payer: Self-pay

## 2020-12-11 ENCOUNTER — Emergency Department (HOSPITAL_COMMUNITY)
Admission: EM | Admit: 2020-12-11 | Discharge: 2020-12-11 | Disposition: A | Discharge status: Home / Self Care | Attending: Emergency Medicine | Admitting: Emergency Medicine

## 2020-12-11 DIAGNOSIS — T189XXA Foreign body of alimentary tract, part unspecified, initial encounter: Secondary | ICD-10-CM | POA: Insufficient documentation

## 2020-12-11 DIAGNOSIS — Z21 Asymptomatic human immunodeficiency virus [HIV] infection status: Secondary | ICD-10-CM | POA: Diagnosis not present

## 2020-12-11 DIAGNOSIS — X58XXXA Exposure to other specified factors, initial encounter: Secondary | ICD-10-CM | POA: Insufficient documentation

## 2020-12-11 MED ORDER — ALUM & MAG HYDROXIDE-SIMETH 200-200-20 MG/5ML PO SUSP
30.0000 mL | Freq: Once | ORAL | Status: AC
  Administered 2020-12-11: 30 mL via ORAL
  Filled 2020-12-11: qty 30

## 2020-12-11 MED ORDER — LIDOCAINE VISCOUS HCL 2 % MT SOLN
15.0000 mL | Freq: Once | OROMUCOSAL | Status: AC
  Administered 2020-12-11: 15 mL via ORAL
  Filled 2020-12-11: qty 15

## 2020-12-11 NOTE — Discharge Instructions (Addendum)
You have 3 batteries in your stomach and one battery in your rectum.  You should be able to pass them in about a week. If you do not pass the batteries please return to the ED in a week for repeat x-ray.  Follow-up with your doctor  Please make sure he has no access to any foreign body or batteries  Return to ER if you have abdominal pain and vomiting

## 2020-12-11 NOTE — ED Provider Notes (Signed)
[facility]-EMERGENC[facility] DEPT Provider Note   CSN: [id number] Arrival date & time: 12/11/20  1739     History Chief Complaint  Patient presents with  . Swallowed Foreign Body    Ricky Barker is a 30 y.o. male history of HIV, here presenting with battery ingestion.  Patient states that he was upset today and swallowed 3 batteries.  He told him for but he actually told me 3 and he swallowed one several weeks ago.  He also swallowed the metal nose piece of the mask.  Patient has some [facility] at prison and was sent here for evaluation. Denies any suicidal ideation but was just upset.   The history is provided by the patient.       Past Medical History:  Diagnosis Date  . HIV (human immunodeficiency virus infection) (HCC)   . Patient refuses to disclose advance directive information     Patient Active Problem List   Diagnosis Date Noted  . Patient refuses to disclose advance directive information     History reviewed. No pertinent surgical history.     History reviewed. No pertinent family history.  Social History   Tobacco Use  . Smoking status: Smoker, Current Status Unknown    Home Medications Prior to Admission medications   Medication Sig Start Date End Date Taking? Authorizing Provider  erythromycin ophthalmic ointment Place a 1/2 inch ribbon of ointment into the lower eyelid four times daily. 11/12/20   Gerhard Munch, MD  prednisoLONE acetate (PRED FORTE) 1 % ophthalmic suspension Place 1 drop into the left eye 4 (four) times daily. 11/12/20   Gerhard Munch, MD    Allergies    Acetaminophen and Cantaloupe (diagnostic)  Review of Systems   Review of Systems  Gastrointestinal: Positive for vomiting.  All other systems reviewed and are negative.   Physical Exam Updated Vital Signs BP (!) 152/89 (BP Location: Right Arm)   Pulse 84   Temp 98.1 F (36.7 C) (Oral)   Resp 18   SpO2 100%   Physical Exam Vitals and nursing note  reviewed.  HENT:     Head: Normocephalic.     Nose: Nose normal.     Mouth/Throat:     Mouth: Mucous membranes are moist.  Eyes:     Pupils: Pupils are equal, round, and reactive to light.  Cardiovascular:     Rate and Rhythm: Normal rate and regular rhythm.     Pulses: Normal pulses.     Heart sounds: Normal heart sounds.  Pulmonary:     Effort: Pulmonary effort is normal.     Breath sounds: Normal breath sounds.  Abdominal:     General: Abdomen is flat.     Palpations: Abdomen is soft.  Musculoskeletal:        General: Normal range of motion.     Cervical back: Normal range of motion and neck supple.  Skin:    General: Skin is warm.     Capillary Refill: Capillary refill takes less than 2 seconds.  Neurological:     General: No focal deficit present.     Mental Status: He is alert and oriented to person, place, and time.  Psychiatric:     Comments: Poor judgment      ED Results / Procedures / Treatments   Labs (all labs ordered are listed, but only abnormal results are displayed) Labs Reviewed - No data to display  EKG None  Radiology DG Chest 2 View  Result Date: 12/11/2020 CLINICAL  DATA:  29 year old male with foreign body ingestion. EXAM: CHEST - 2 VIEW; ABDOMEN - 1 VIEW COMPARISON:  None FINDINGS: The lungs are clear. There is no pleural effusion pneumothorax. The cardiac silhouette is within limits. Three cylindrical radiopaque foreign objects, likely batteries, noted in the epigastric area, likely in the stomach. There is a single battery in the region of the rectum. Several thin linear radiopaque densities over the left hemiabdomen, likely in the colon. There is moderate stool throughout the colon. No bowel dilatation or evidence of obstruction. No free air. No radiopaque calculi. The osseous structures and soft tissues are unremarkable. IMPRESSION: 1. No acute cardiopulmonary process. 2. Three ingested batteries likely in the stomach. Single battery in the region  of the rectum. No bowel obstruction. Electronically Signed   By: Elgie Collard M.D.   On: 12/11/2020 19:01    Procedures Procedures (including critical care time)  Medications Ordered in ED Medications  alum & mag hydroxide-simeth (MAALOX/MYLANTA) 200-200-20 MG/5ML suspension 30 mL (30 mLs Oral Given 12/11/20 1935)    And  lidocaine (XYLOCAINE) 2 % viscous mouth solution 15 mL (15 mLs Oral Given 12/11/20 1935)    ED Course  I have reviewed the triage vital signs and the nursing notes.  Pertinent labs & imaging results that were available during my care of the patient were reviewed by me and considered in my medical decision making (see chart for details).    MDM Rules/Calculators/A&P                         Castle Gorelik is a 30 y.o. male for presenting with battery ingestion.  He initially only told me 3 batteries but when I noticed that he had battery in his rectum on x-ray he told me he did swallow 1 several weeks ago.  He also swallowed a small metal clip as part of his mask.  Patient is well-appearing in the ED.  I discussed case with Dr. Ewing Schlein from GI.  He recommended that I talk to poison control.  I talked to poison control and they state that this battery will likely pass on its own in a week and does not need to be taken out with endoscopy.  Will give GI cocktail and p.o. trial.   8:09 PM Tolerated p.o. in the ED.  Recommend repeat x-ray if he does not pass the battery in his stool in a week  Final Clinical Impression(s) / ED Diagnoses Final diagnoses:  None    Rx / DC Orders ED Discharge Orders    None       Charlynne Pander, MD 12/11/20 2010

## 2020-12-11 NOTE — ED Triage Notes (Signed)
Pt states he ingested x4 AAA batteries and metal scraps x2 hours ago at jail. States he vomited once after.

## 2022-08-16 IMAGING — CT CT ORBITS W/O CM
3 series · 14 of 47 positions shown, 16 images · non-contrast
Comparison: None.

CLINICAL DATA: Diplopia L eye blurry vision - eval for retinal
detachment vs. vitreous hemorrhage.

Patient reports he was banging his head against the wall now has
hematoma. Headache and blurry vision.
EXAM:
CT ORBITS WITHOUT CONTRAST
TECHNIQUE: Multidetector CT images were obtained using the standard protocol
without intravenous contrast.

[Series 3: orbits 2.0 h30s st · axial · 0.33mm/px · z∈[-140,-52]mm · 8 of 52 slices shown, 10 images]
[im 4/52  brain]
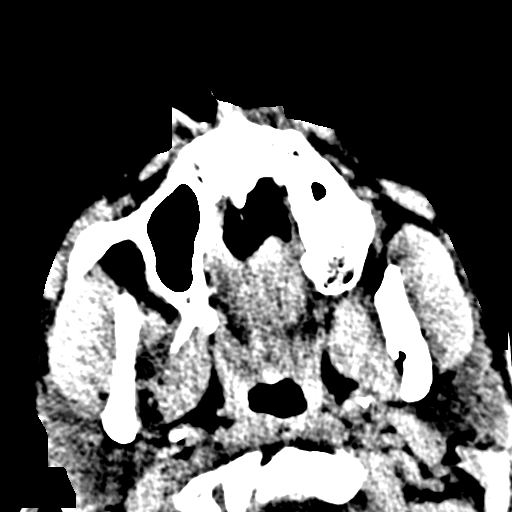
[im 4/52  bone]
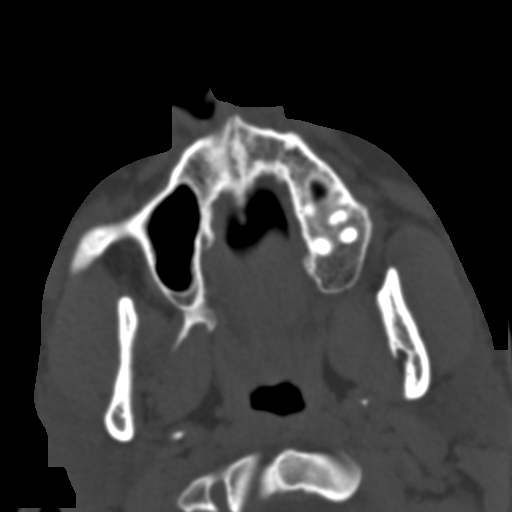
[im 11/52  bone]
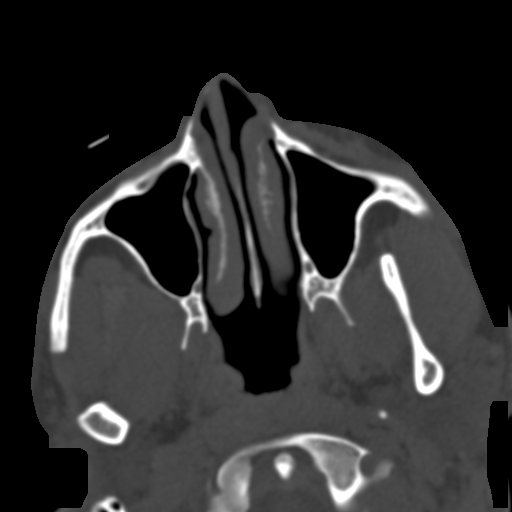
[im 16/52  bone]
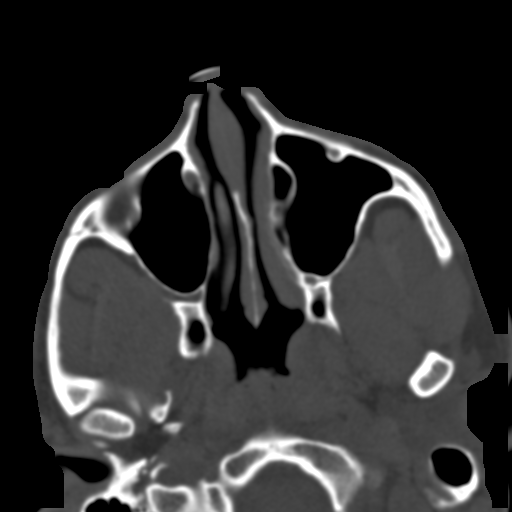
[im 23/52  bone]
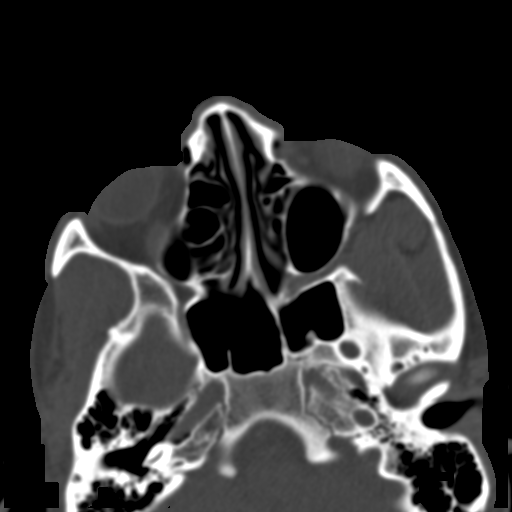
[im 29/52  brain]
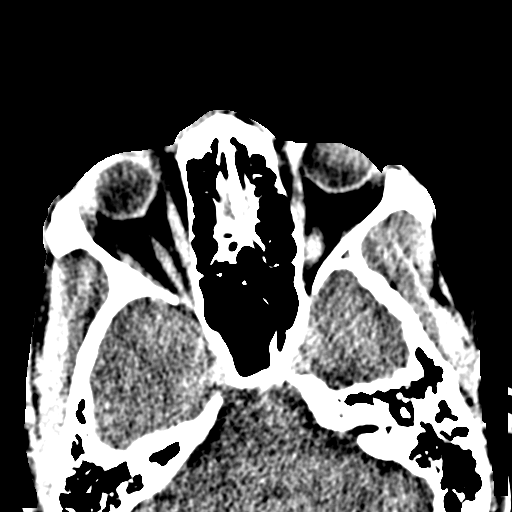
[im 29/52  bone]
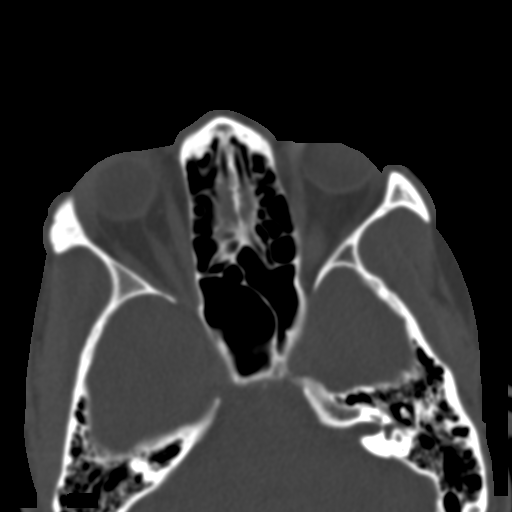
[im 36/52  bone]
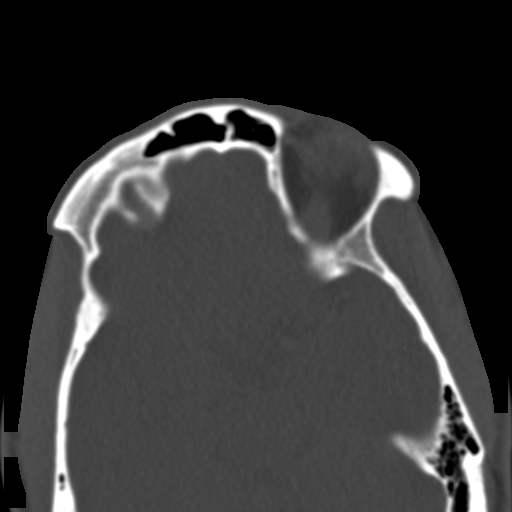
[im 41/52  bone]
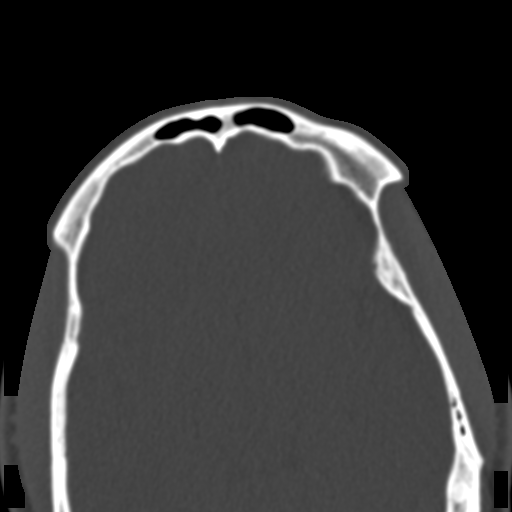
[im 48/52  bone]
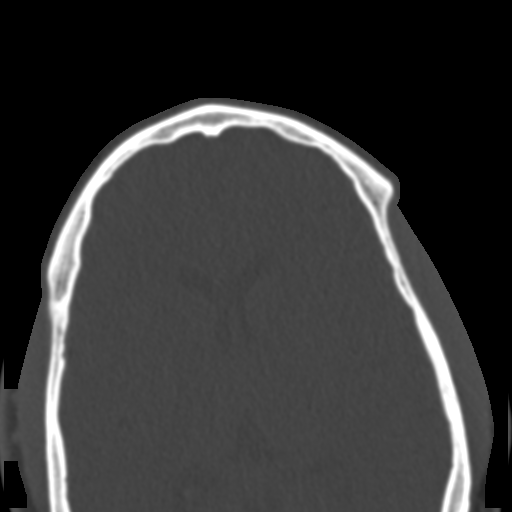

[Series 8: orbits st coronal · coronal · 0.24mm/px · 3 of 88 slices shown]
[im 30/88  bone]
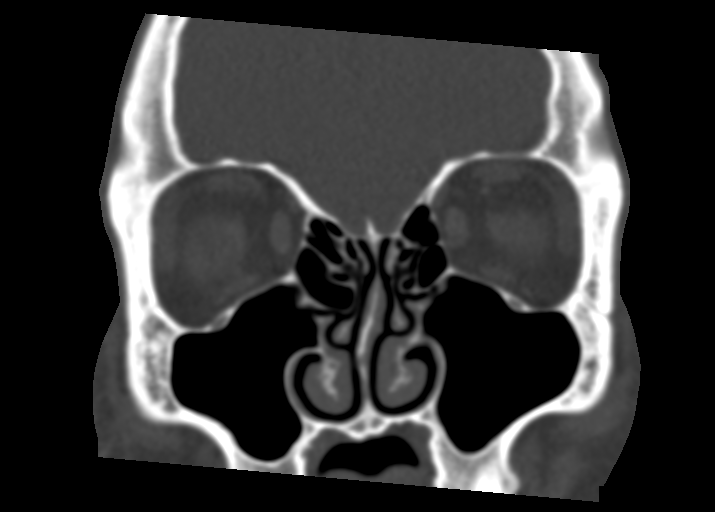
[im 39/88  bone]
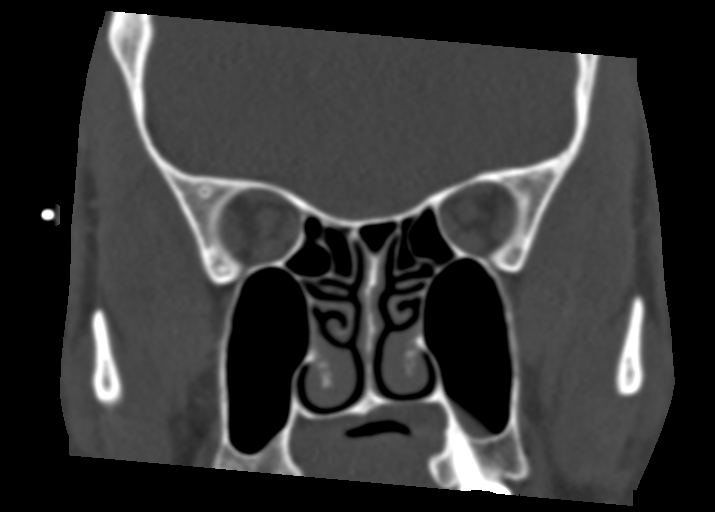
[im 49/88  bone]
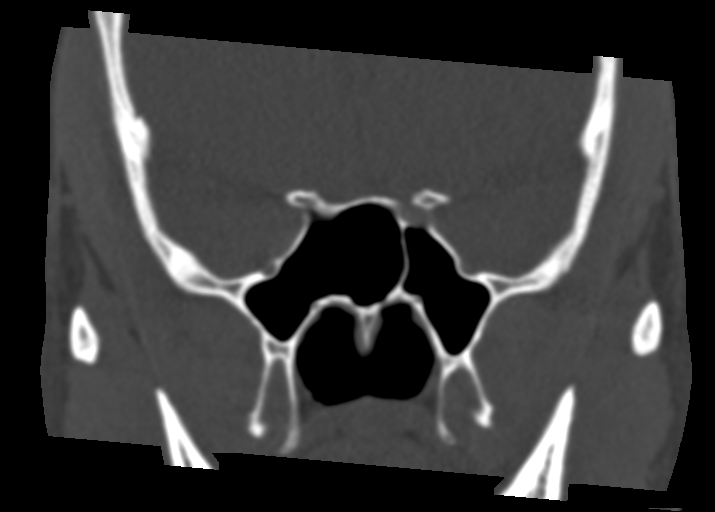

[Series 9: orbits st sagittal · sagittal · 0.24mm/px · 3 of 85 slices shown]
[im 29/85  bone]
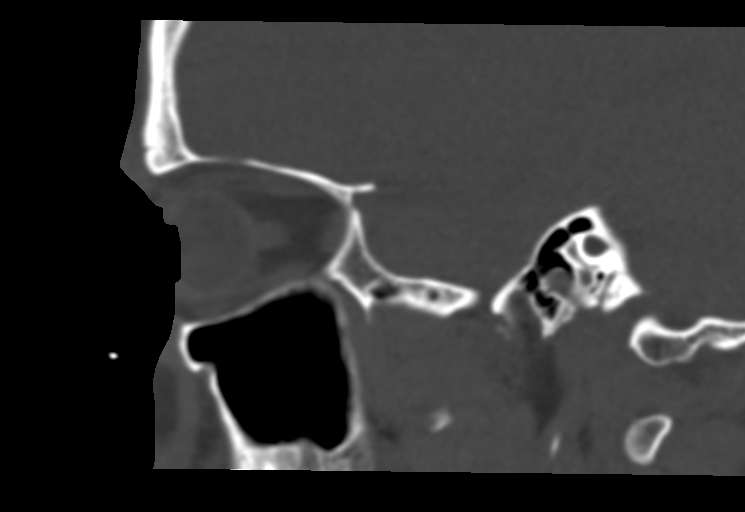
[im 43/85  bone]
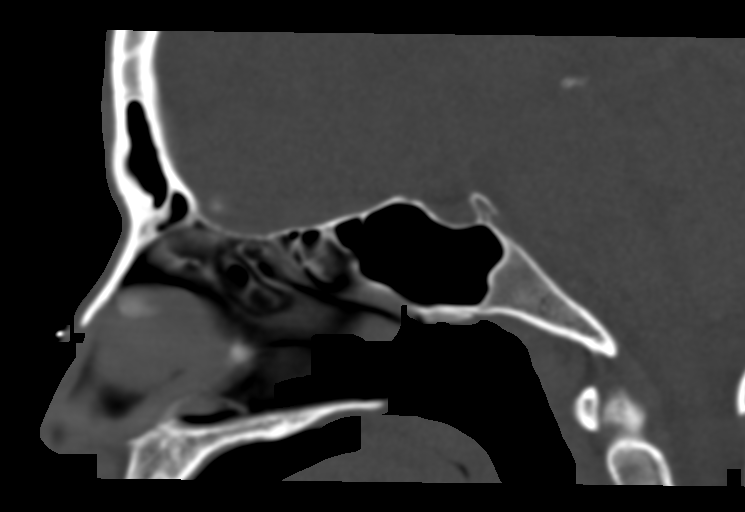
[im 57/85  bone]
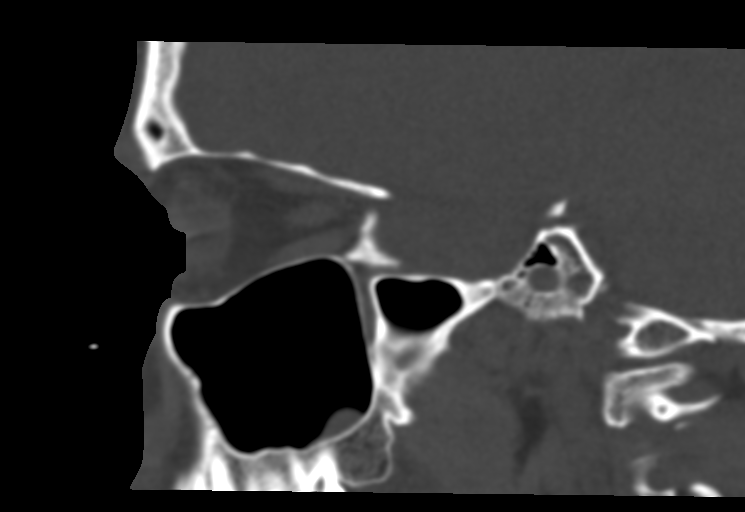

[14 of 47 positions shown; findings below may reference images not displayed]

FINDINGS: Orbits: No traumatic or inflammatory finding. Globes, optic nerves,
orbital fat, extraocular muscles, vascular structures, and lacrimal
glands are normal.

Visualized sinuses: Trace mucosal thickening inferior left maxillary
sinuses. No sinus fracture or fluid level.

Soft tissues: Negative.

Limited intracranial: Assessed on concurrent head CT, reported
separately.
IMPRESSION: Negative CT of the orbits.
